# Patient Record
Sex: Female | Born: 1970
Health system: Southern US, Community
[De-identification: ages and names within clinical notes are randomized; demographics above are authoritative.]

## PROBLEM LIST (undated history)

## (undated) DIAGNOSIS — R519 Headache, unspecified: Secondary | ICD-10-CM

## (undated) DIAGNOSIS — R87810 Cervical high risk human papillomavirus (HPV) DNA test positive: Secondary | ICD-10-CM

## (undated) DIAGNOSIS — E559 Vitamin D deficiency, unspecified: Secondary | ICD-10-CM

## (undated) DIAGNOSIS — N920 Excessive and frequent menstruation with regular cycle: Secondary | ICD-10-CM

## (undated) DIAGNOSIS — B019 Varicella without complication: Secondary | ICD-10-CM

## (undated) DIAGNOSIS — T7840XA Allergy, unspecified, initial encounter: Secondary | ICD-10-CM

## (undated) DIAGNOSIS — N809 Endometriosis, unspecified: Secondary | ICD-10-CM

## (undated) DIAGNOSIS — G4700 Insomnia, unspecified: Secondary | ICD-10-CM

## (undated) DIAGNOSIS — J302 Other seasonal allergic rhinitis: Secondary | ICD-10-CM

## (undated) DIAGNOSIS — N760 Acute vaginitis: Secondary | ICD-10-CM

## (undated) DIAGNOSIS — N390 Urinary tract infection, site not specified: Secondary | ICD-10-CM

## (undated) DIAGNOSIS — D259 Leiomyoma of uterus, unspecified: Secondary | ICD-10-CM

## (undated) DIAGNOSIS — R87610 Atypical squamous cells of undetermined significance on cytologic smear of cervix (ASC-US): Secondary | ICD-10-CM

## (undated) DIAGNOSIS — K921 Melena: Secondary | ICD-10-CM

## (undated) DIAGNOSIS — F419 Anxiety disorder, unspecified: Secondary | ICD-10-CM

## (undated) DIAGNOSIS — R51 Headache: Secondary | ICD-10-CM

## (undated) DIAGNOSIS — B9689 Other specified bacterial agents as the cause of diseases classified elsewhere: Secondary | ICD-10-CM

## (undated) HISTORY — PX: UPPER GASTROINTESTINAL ENDOSCOPY: SHX188

## (undated) HISTORY — DX: Vitamin D deficiency, unspecified: E55.9

## (undated) HISTORY — DX: Leiomyoma of uterus, unspecified: D25.9

## (undated) HISTORY — DX: Cervical high risk human papillomavirus (HPV) DNA test positive: R87.810

## (undated) HISTORY — DX: Other seasonal allergic rhinitis: J30.2

## (undated) HISTORY — DX: Anxiety disorder, unspecified: F41.9

## (undated) HISTORY — DX: Endometriosis, unspecified: N80.9

## (undated) HISTORY — DX: Urinary tract infection, site not specified: N39.0

## (undated) HISTORY — DX: Allergy, unspecified, initial encounter: T78.40XA

## (undated) HISTORY — PX: ENDOMETRIAL BIOPSY: SHX622

## (undated) HISTORY — DX: Varicella without complication: B01.9

## (undated) HISTORY — DX: Headache: R51

## (undated) HISTORY — DX: Excessive and frequent menstruation with regular cycle: N92.0

## (undated) HISTORY — DX: Insomnia, unspecified: G47.00

## (undated) HISTORY — PX: COLONOSCOPY: SHX174

## (undated) HISTORY — DX: Other specified bacterial agents as the cause of diseases classified elsewhere: B96.89

## (undated) HISTORY — DX: Melena: K92.1

## (undated) HISTORY — PX: ESOPHAGOGASTRODUODENOSCOPY: SHX1529

## (undated) HISTORY — DX: Headache, unspecified: R51.9

## (undated) HISTORY — DX: Other specified bacterial agents as the cause of diseases classified elsewhere: N76.0

## (undated) HISTORY — DX: Atypical squamous cells of undetermined significance on cytologic smear of cervix (ASC-US): R87.610

---

## 2002-12-07 HISTORY — PX: HYSTEROSCOPY: SHX211

## 2004-11-02 ENCOUNTER — Emergency Department: Payer: Self-pay | Admitting: Emergency Medicine

## 2005-01-07 HISTORY — PX: TOTAL ABDOMINAL HYSTERECTOMY: SHX209

## 2005-02-03 ENCOUNTER — Inpatient Hospital Stay: Payer: Self-pay | Admitting: Unknown Physician Specialty

## 2007-07-20 ENCOUNTER — Ambulatory Visit: Payer: Self-pay | Admitting: Unknown Physician Specialty

## 2011-09-17 ENCOUNTER — Ambulatory Visit: Payer: Self-pay

## 2012-09-21 ENCOUNTER — Ambulatory Visit: Payer: Self-pay

## 2012-09-22 ENCOUNTER — Ambulatory Visit: Payer: Self-pay

## 2013-09-14 ENCOUNTER — Ambulatory Visit: Payer: Self-pay

## 2014-09-25 ENCOUNTER — Ambulatory Visit: Payer: Self-pay

## 2015-08-26 LAB — HM PAP SMEAR: HM PAP: NEGATIVE

## 2015-10-03 ENCOUNTER — Other Ambulatory Visit: Payer: Self-pay | Admitting: Obstetrics and Gynecology

## 2015-10-03 DIAGNOSIS — Z1231 Encounter for screening mammogram for malignant neoplasm of breast: Secondary | ICD-10-CM

## 2015-10-08 ENCOUNTER — Ambulatory Visit
Admission: RE | Admit: 2015-10-08 | Discharge: 2015-10-08 | Disposition: A | Discharge status: Home / Self Care | Payer: BLUE CROSS/BLUE SHIELD | Source: Ambulatory Visit | Attending: Obstetrics and Gynecology | Admitting: Obstetrics and Gynecology

## 2015-10-08 DIAGNOSIS — Z1231 Encounter for screening mammogram for malignant neoplasm of breast: Secondary | ICD-10-CM

## 2016-05-14 DIAGNOSIS — E668 Other obesity: Secondary | ICD-10-CM | POA: Diagnosis not present

## 2016-05-14 DIAGNOSIS — F411 Generalized anxiety disorder: Secondary | ICD-10-CM | POA: Diagnosis not present

## 2016-05-28 DIAGNOSIS — B9689 Other specified bacterial agents as the cause of diseases classified elsewhere: Secondary | ICD-10-CM | POA: Diagnosis not present

## 2016-05-28 DIAGNOSIS — N760 Acute vaginitis: Secondary | ICD-10-CM | POA: Diagnosis not present

## 2016-05-28 DIAGNOSIS — N898 Other specified noninflammatory disorders of vagina: Secondary | ICD-10-CM | POA: Diagnosis not present

## 2016-06-08 DIAGNOSIS — E669 Obesity, unspecified: Secondary | ICD-10-CM | POA: Diagnosis not present

## 2016-06-08 DIAGNOSIS — F411 Generalized anxiety disorder: Secondary | ICD-10-CM | POA: Diagnosis not present

## 2016-06-26 DIAGNOSIS — E669 Obesity, unspecified: Secondary | ICD-10-CM | POA: Diagnosis not present

## 2016-07-10 ENCOUNTER — Encounter: Payer: Self-pay | Admitting: Dietician

## 2016-07-10 ENCOUNTER — Encounter: Payer: BLUE CROSS/BLUE SHIELD | Attending: Internal Medicine | Admitting: Dietician

## 2016-07-10 VITALS — Ht 64.0 in | Wt 222.0 lb

## 2016-07-10 DIAGNOSIS — J309 Allergic rhinitis, unspecified: Secondary | ICD-10-CM

## 2016-07-10 DIAGNOSIS — E669 Obesity, unspecified: Secondary | ICD-10-CM

## 2016-07-10 DIAGNOSIS — N809 Endometriosis, unspecified: Secondary | ICD-10-CM | POA: Insufficient documentation

## 2016-07-10 HISTORY — DX: Allergic rhinitis, unspecified: J30.9

## 2016-07-10 NOTE — Patient Instructions (Signed)
   Keep portions of starchy foods to 1 cup or less (fist-size), and include vegetables as much as possible with your meals.   Track your food intake using your phone app, aiming for 1400 calories daily.   Keep up your healthy food choices, and eating every 3-5 hours.   Continue with regular exercise, great job so far!

## 2016-07-10 NOTE — Progress Notes (Signed)
Medical Nutrition Therapy: Visit start time: 1100  end time: 1200  Assessment:  Diagnosis: obesity  Past medical history: none significant Psychosocial issues/ stress concerns: patient reports moderate stress level; single mom Preferred learning method:  . No preference indicated  Current weight: 222lbs  Height: 5'4" Medications, supplements: reviewed list in chart with patient  Progress and evaluation: Patient reports weight has been stable recently. No prior efforts at weight loss. She has not noticed any weight loss since taking phentermine for the past 2 months.  She has made several diet changes in the past week. Typically has eaten more meals out -- fast food, Cheerwine floats, some cereal at night.   Physical activity: walking 30-40 minutes, 4 times a week  Dietary Intake:  Usual eating pattern includes 3 meals and 2 snacks per day. Dining out frequency: 2-3 meals in past week, was dining out up to 8 meals per week.  Breakfast: 7:30 oatmeal 1 pkg, 2 [place] sausage links, 1c coffee Snack: 10 apple Lunch: 12 [place] sandwich with lett, tom, mayo, cherries, strawberries sm portions; chicken salad and fruit Snack: kellogs protein bar Supper: spaghetti, chicken salad with crackers; burger bites (2) and fries (Chili's);  Snack: none Beverages: water, some Crystal Light, rarely soda  Nutrition Care Education: Topics covered: weight management Basic nutrition: basic food groups, appropriate nutrient balance, appropriate meal and snack schedule, general nutrition guidelines; discussed options for increasing vegetable intake.    Weight control: Importance of low fat and low sugar foods, guidance for 1400 kcal daily intake, encouraging generous vegetable portions and controlled portions of starches and meats; encouraged planning ahead for quick and easy meals to have at home; benefits of tracking food intake and exercise; role of exercise in managing weight.   Nutritional Diagnosis:  [place]-3.3  Overweight/obesity As related to excess calories.  As evidenced by patient report.  Intervention: Instruction as noted above.   Set goals with patient direction.    She does not feel she needs RD follow-up at this time, but will schedule later if necessary.   Education Materials given:  . Food lists/ Planning A Balanced Meal . Sample meal pattern/ menus: Quick and Healthy Meal Ideas . Goals/ instructions  Learner/ who was taught:  . Patient   Level of understanding: Marland Kitchen Verbalizes/ demonstrates competency  Demonstrated degree of understanding via:   Teach back Learning barriers: . None  Willingness to learn/ readiness for change: . Eager, change in progress  Monitoring and Evaluation:  Dietary intake, exercise, and body weight      follow up: prn

## 2016-08-18 DIAGNOSIS — J309 Allergic rhinitis, unspecified: Secondary | ICD-10-CM | POA: Diagnosis not present

## 2016-08-18 DIAGNOSIS — E663 Overweight: Secondary | ICD-10-CM | POA: Diagnosis not present

## 2016-09-02 DIAGNOSIS — Z1239 Encounter for other screening for malignant neoplasm of breast: Secondary | ICD-10-CM | POA: Diagnosis not present

## 2016-09-02 DIAGNOSIS — Z315 Encounter for genetic counseling: Secondary | ICD-10-CM | POA: Diagnosis not present

## 2016-09-02 DIAGNOSIS — Z803 Family history of malignant neoplasm of breast: Secondary | ICD-10-CM | POA: Diagnosis not present

## 2016-09-02 DIAGNOSIS — Z01419 Encounter for gynecological examination (general) (routine) without abnormal findings: Secondary | ICD-10-CM | POA: Diagnosis not present

## 2016-09-10 DIAGNOSIS — Z23 Encounter for immunization: Secondary | ICD-10-CM | POA: Diagnosis not present

## 2016-09-17 DIAGNOSIS — E663 Overweight: Secondary | ICD-10-CM | POA: Diagnosis not present

## 2016-09-17 DIAGNOSIS — F411 Generalized anxiety disorder: Secondary | ICD-10-CM | POA: Diagnosis not present

## 2016-09-17 DIAGNOSIS — J3089 Other allergic rhinitis: Secondary | ICD-10-CM | POA: Diagnosis not present

## 2016-09-29 ENCOUNTER — Other Ambulatory Visit: Payer: Self-pay | Admitting: Obstetrics and Gynecology

## 2016-09-29 DIAGNOSIS — Z1231 Encounter for screening mammogram for malignant neoplasm of breast: Secondary | ICD-10-CM

## 2016-10-14 ENCOUNTER — Ambulatory Visit
Admission: RE | Admit: 2016-10-14 | Discharge: 2016-10-14 | Disposition: A | Discharge status: Home / Self Care | Payer: BLUE CROSS/BLUE SHIELD | Source: Ambulatory Visit | Attending: Obstetrics and Gynecology | Admitting: Obstetrics and Gynecology

## 2016-10-14 DIAGNOSIS — Z1231 Encounter for screening mammogram for malignant neoplasm of breast: Secondary | ICD-10-CM | POA: Insufficient documentation

## 2016-11-16 DIAGNOSIS — B9689 Other specified bacterial agents as the cause of diseases classified elsewhere: Secondary | ICD-10-CM | POA: Diagnosis not present

## 2016-11-16 DIAGNOSIS — N898 Other specified noninflammatory disorders of vagina: Secondary | ICD-10-CM | POA: Diagnosis not present

## 2016-11-16 DIAGNOSIS — N760 Acute vaginitis: Secondary | ICD-10-CM | POA: Diagnosis not present

## 2017-01-11 DIAGNOSIS — B9689 Other specified bacterial agents as the cause of diseases classified elsewhere: Secondary | ICD-10-CM | POA: Diagnosis not present

## 2017-01-11 DIAGNOSIS — N760 Acute vaginitis: Secondary | ICD-10-CM | POA: Diagnosis not present

## 2017-01-11 DIAGNOSIS — N898 Other specified noninflammatory disorders of vagina: Secondary | ICD-10-CM | POA: Diagnosis not present

## 2017-04-30 DIAGNOSIS — E559 Vitamin D deficiency, unspecified: Secondary | ICD-10-CM | POA: Diagnosis not present

## 2017-04-30 DIAGNOSIS — R5383 Other fatigue: Secondary | ICD-10-CM | POA: Diagnosis not present

## 2017-04-30 DIAGNOSIS — E040 Nontoxic diffuse goiter: Secondary | ICD-10-CM | POA: Diagnosis not present

## 2017-04-30 DIAGNOSIS — J301 Allergic rhinitis due to pollen: Secondary | ICD-10-CM | POA: Diagnosis not present

## 2017-05-05 DIAGNOSIS — E049 Nontoxic goiter, unspecified: Secondary | ICD-10-CM | POA: Diagnosis not present

## 2017-05-28 DIAGNOSIS — F411 Generalized anxiety disorder: Secondary | ICD-10-CM | POA: Diagnosis not present

## 2017-05-28 DIAGNOSIS — R010 Benign and innocent cardiac murmurs: Secondary | ICD-10-CM | POA: Diagnosis not present

## 2017-05-28 DIAGNOSIS — E559 Vitamin D deficiency, unspecified: Secondary | ICD-10-CM | POA: Diagnosis not present

## 2017-07-09 DIAGNOSIS — J3089 Other allergic rhinitis: Secondary | ICD-10-CM | POA: Diagnosis not present

## 2017-07-09 DIAGNOSIS — R030 Elevated blood-pressure reading, without diagnosis of hypertension: Secondary | ICD-10-CM | POA: Diagnosis not present

## 2017-07-09 DIAGNOSIS — R010 Benign and innocent cardiac murmurs: Secondary | ICD-10-CM | POA: Diagnosis not present

## 2017-07-09 DIAGNOSIS — F411 Generalized anxiety disorder: Secondary | ICD-10-CM | POA: Diagnosis not present

## 2017-09-06 ENCOUNTER — Telehealth: Payer: Self-pay

## 2017-09-06 ENCOUNTER — Other Ambulatory Visit: Payer: Self-pay | Admitting: Obstetrics and Gynecology

## 2017-09-06 MED ORDER — TERCONAZOLE 0.8 % VA CREA: 1.0000 | TOPICAL_CREAM | Freq: Every day | VAGINAL | 0 refills | Status: AC

## 2017-09-06 NOTE — Telephone Encounter (Signed)
Pt states she does not have a fishy odor.  She uses [facility].  Pt aware rx will be sent.

## 2017-09-06 NOTE — Telephone Encounter (Signed)
Pt calling to see if [facility] would rx cream for BV.  She is having the same sxs as before - itching and a little d/c. [phone]  Left detailed msg that [facility] may want to see her since it was 01/2017 that we saw her last.  We also need name of her pharm.

## 2017-09-06 NOTE — Progress Notes (Signed)
Rx RF terazol for yeast vag sx.

## 2017-09-06 NOTE — Telephone Encounter (Signed)
Is she having fishy odor? Hx of BV and yeast in past, so I will send in crm. Just need to know which one to treat for. Thx.

## 2017-09-06 NOTE — Telephone Encounter (Signed)
Rx terazol eRxd for yeast vag. F/u prn sx.

## 2017-09-10 ENCOUNTER — Encounter: Payer: Self-pay | Admitting: Maternal Newborn

## 2017-09-10 ENCOUNTER — Ambulatory Visit (INDEPENDENT_AMBULATORY_CARE_PROVIDER_SITE_OTHER): Payer: BLUE CROSS/BLUE SHIELD | Admitting: Maternal Newborn

## 2017-09-10 VITALS — BP 122/84 | HR 63 | Ht 65.0 in | Wt 224.0 lb

## 2017-09-10 DIAGNOSIS — N898 Other specified noninflammatory disorders of vagina: Secondary | ICD-10-CM

## 2017-09-10 DIAGNOSIS — B9689 Other specified bacterial agents as the cause of diseases classified elsewhere: Secondary | ICD-10-CM

## 2017-09-10 DIAGNOSIS — N760 Acute vaginitis: Secondary | ICD-10-CM | POA: Diagnosis not present

## 2017-09-10 LAB — POCT WET PREP (WET MOUNT)
Clue Cells Wet Prep Whiff POC: NEGATIVE
TRICHOMONAS WET PREP HPF POC: ABSENT

## 2017-09-10 MED ORDER — SOLOSEC 2 G PO PACK: 1.0000 | PACK | Freq: Once | ORAL | 0 refills | Status: AC

## 2017-09-10 NOTE — Progress Notes (Signed)
Obstetrics & Gynecology Office Visit   Chief Complaint:  Chief Complaint  Patient presents with  . Vaginitis    History of Present Illness: [name] is a 46 y.o. [id number].  No LMP recorded. Patient has had a hysterectomy. She presents today for a problem visit.   Patient complains of an abnormal vaginal discharge for 7 days. Discharge described as: copious, white, thin and malodorous. Vaginal symptoms include local irritation, odor and pain.Vulvar symptoms include local irritation and vulvar itching. STI Risk: Very low risk of STD exposure.   Other associated symptoms: burning after using yeast treatment prescribed by another provider .Menstrual pattern: absent. Contraception: status post hysterectomy.  She denies recent antibiotic exposure, denies changes in soaps, detergents coinciding with the onset of her symptoms.  She has previously self treated or been under treatment by another provider for these symptoms.    Review of Systems: 10 point review of systems negative unless otherwise noted in HPI.  Past Medical History:  Past Medical History:  Diagnosis Date  . Anxiety   . Seasonal allergies   . Vitamin D deficiency     Past Surgical History:  Past Surgical History:  Procedure Laterality Date  . ABDOMINAL HYSTERECTOMY  2006   partial, still has ovaries    Gynecologic History: No LMP recorded. Patient has had a hysterectomy.  Obstetric History: [id number]  Family History:  Family History  Problem Relation Age of Onset  . Breast cancer Paternal Aunt 71  . Diabetes Paternal Aunt     Social History:  Social History   Social History  . Marital status: Divorced    Spouse name: N/A  . Number of children: N/A  . Years of education: N/A   Occupational History  . Not on file.   Social History Main Topics  . Smoking status: Never Smoker  . Smokeless tobacco: Never Used  . Alcohol use Yes     Comment: occasionally  . Drug use: No  . Sexual activity: Yes   Birth control/ protection: Surgical   Other Topics Concern  . Not on file   Social History Narrative  . No narrative on file    Allergies:  Allergies  Allergen Reactions  . Iodine Anaphylaxis    Medications: Prior to Admission medications   Medication Sig Start Date End Date Taking? Authorizing Provider  Cholecalciferol (VITAMIN D3) 50000 units CAPS  09/06/17  Yes [provider]  citalopram (CELEXA) 20 MG tablet Take 20 mg by mouth daily. 07/26/17  Yes [provider]  montelukast (SINGULAIR) 10 MG tablet Take 10 mg by mouth daily. 06/23/16  Yes [provider]    Physical Exam Vitals:  Vitals:   09/10/17 1106  BP: 122/84  Pulse: 63   No LMP recorded. Patient has had a hysterectomy.  General: NAD HEENT: normocephalic, anicteric Pulmonary: No increased work of breathing Genitourinary:  External: Labia appear slightly swollen. Normal urethral meatus, normal  Bartholin's and Skene's glands.    Vagina: Vaginal mucosa appear erythmatous, thin, white, adherent discharge   Cervix: Deferred  Uterus: Surgically absent  Adnexa: deferred  Rectal: deferred  Lymphatic: no evidence of inguinal lymphadenopathy Neurologic: Grossly intact Psychiatric: mood appropriate, affect full  Amsel's Criteria for Bacterial Vaginosis Clinical diagnosis requires presence of 3 of the below 4:  1) Homogenous thin white discharge present 2) Presence of clue cells present 3) Vaginal pH >4.5 present 4) Positive whiff test absent  Assessment: 46 y.o. [id number] with bacterial vaginosis.  Plan:  Problem List Items Addressed This Visit   Visit Diagnoses    Bacterial vaginosis    -  Primary   Relevant Medications   SOLOSEC 2 g PACK   Vaginal irritation         Rx sent to pharmacy, patient education materials given for Solosec. Patient to call if Solosec is not affordable for her, will prescribe Flagyl PRN.  Return if symptoms worsen or fail to improve.  Avel Sensor, CNM 46/04/2017  11:46 AM

## 2017-09-30 ENCOUNTER — Ambulatory Visit: Payer: Self-pay | Admitting: Obstetrics and Gynecology

## 2017-10-02 DIAGNOSIS — Z91048 Other nonmedicinal substance allergy status: Secondary | ICD-10-CM | POA: Diagnosis not present

## 2017-10-02 DIAGNOSIS — R030 Elevated blood-pressure reading, without diagnosis of hypertension: Secondary | ICD-10-CM | POA: Diagnosis not present

## 2017-10-02 DIAGNOSIS — Z79891 Long term (current) use of opiate analgesic: Secondary | ICD-10-CM | POA: Diagnosis not present

## 2017-10-02 DIAGNOSIS — Z79899 Other long term (current) drug therapy: Secondary | ICD-10-CM | POA: Diagnosis not present

## 2017-10-02 DIAGNOSIS — M545 Low back pain: Secondary | ICD-10-CM | POA: Diagnosis not present

## 2017-10-07 ENCOUNTER — Other Ambulatory Visit: Payer: Self-pay | Admitting: Obstetrics and Gynecology

## 2017-10-07 DIAGNOSIS — Z1231 Encounter for screening mammogram for malignant neoplasm of breast: Secondary | ICD-10-CM

## 2017-10-11 ENCOUNTER — Other Ambulatory Visit: Payer: Self-pay

## 2017-10-11 ENCOUNTER — Emergency Department: Payer: BLUE CROSS/BLUE SHIELD

## 2017-10-11 ENCOUNTER — Emergency Department
Admission: EM | Admit: 2017-10-11 | Discharge: 2017-10-11 | Disposition: A | Discharge status: Home / Self Care | Payer: BLUE CROSS/BLUE SHIELD | Attending: Emergency Medicine | Admitting: Emergency Medicine

## 2017-10-11 ENCOUNTER — Encounter: Payer: Self-pay | Admitting: Emergency Medicine

## 2017-10-11 DIAGNOSIS — S82832A Other fracture of upper and lower end of left fibula, initial encounter for closed fracture: Secondary | ICD-10-CM | POA: Diagnosis not present

## 2017-10-11 DIAGNOSIS — Y929 Unspecified place or not applicable: Secondary | ICD-10-CM | POA: Insufficient documentation

## 2017-10-11 DIAGNOSIS — Y998 Other external cause status: Secondary | ICD-10-CM | POA: Insufficient documentation

## 2017-10-11 DIAGNOSIS — S8992XA Unspecified injury of left lower leg, initial encounter: Secondary | ICD-10-CM | POA: Diagnosis present

## 2017-10-11 DIAGNOSIS — F419 Anxiety disorder, unspecified: Secondary | ICD-10-CM | POA: Insufficient documentation

## 2017-10-11 DIAGNOSIS — S82862A Displaced Maisonneuve's fracture of left leg, initial encounter for closed fracture: Secondary | ICD-10-CM

## 2017-10-11 DIAGNOSIS — S82861A Displaced Maisonneuve's fracture of right leg, initial encounter for closed fracture: Secondary | ICD-10-CM | POA: Diagnosis not present

## 2017-10-11 DIAGNOSIS — S82432A Displaced oblique fracture of shaft of left fibula, initial encounter for closed fracture: Secondary | ICD-10-CM | POA: Diagnosis not present

## 2017-10-11 DIAGNOSIS — Y9389 Activity, other specified: Secondary | ICD-10-CM | POA: Diagnosis not present

## 2017-10-11 DIAGNOSIS — W010XXA Fall on same level from slipping, tripping and stumbling without subsequent striking against object, initial encounter: Secondary | ICD-10-CM | POA: Diagnosis not present

## 2017-10-11 DIAGNOSIS — M25572 Pain in left ankle and joints of left foot: Secondary | ICD-10-CM | POA: Diagnosis not present

## 2017-10-11 DIAGNOSIS — W19XXXA Unspecified fall, initial encounter: Secondary | ICD-10-CM | POA: Diagnosis not present

## 2017-10-11 MED ORDER — HYDROMORPHONE HCL 1 MG/ML IJ SOLN
0.5000 mg | Freq: Once | INTRAMUSCULAR | Status: AC
  Administered 2017-10-11: 0.5 mg via INTRAVENOUS

## 2017-10-11 MED ORDER — HYDROMORPHONE HCL 1 MG/ML IJ SOLN
INTRAMUSCULAR | Status: AC
  Filled 2017-10-11: qty 1

## 2017-10-11 MED ORDER — HYDROMORPHONE HCL 1 MG/ML IJ SOLN
1.0000 mg | Freq: Once | INTRAMUSCULAR | Status: AC
  Administered 2017-10-11: 1 mg via INTRAVENOUS
  Filled 2017-10-11: qty 1

## 2017-10-11 MED ORDER — OXYCODONE-ACETAMINOPHEN 7.5-325 MG PO TABS: 1.0000 | ORAL_TABLET | ORAL | 0 refills | Status: DC | PRN

## 2017-10-11 NOTE — ED Notes (Signed)
Pt asking for something to drink, MD made aware and states pt can not have anything but ice chips at this time until splint is put on. Pt made aware of what MD said and if fine with that. cup of ice chips given to pt at this time. Once pt is given pain medication splint will be put on.

## 2017-10-11 NOTE — ED Provider Notes (Signed)
[facility] Emergency Department Provider Note       Time seen: ----------------------------------------- 7:09 AM on 10/11/2017 -----------------------------------------    I have reviewed the triage vital signs and the nursing notes.   HISTORY   Chief Complaint Fall and Ankle Injury   HPI Erica Mccall is a 46 y.o. female with a history of seasonal allergy who presents to the ED for a fall.  Patient arrives by EMS after falling while going about her normal activities this morning.  She denies feeling ill prior to the fall.  She states she fell from a standing position, has no other injuries other than her left ankle.  She received some fentanyl in route which helped her pain.  Pain is currently moderate in the left ankle  Past Medical History:  Diagnosis Date  . Anxiety   . Seasonal allergies   . Vitamin D deficiency     Patient Active Problem List   Diagnosis Date Noted  . Allergic rhinitis 07/10/2016  . Endometriosis 07/10/2016    Past Surgical History:  Procedure Laterality Date  . ABDOMINAL HYSTERECTOMY  2006   partial, still has ovaries    Allergies Iodine  Social History Social History   Tobacco Use  . Smoking status: Never Smoker  . Smokeless tobacco: Never Used  Substance Use Topics  . Alcohol use: Yes    Comment: occasionally  . Drug use: No    Review of Systems Constitutional: Negative for fever. Cardiovascular: Negative for chest pain. Respiratory: Negative for shortness of breath. Gastrointestinal: Negative for abdominal pain, vomiting and diarrhea. Musculoskeletal: Positive for left ankle pain Skin: Negative for rash. Neurological: Negative for headaches, focal weakness or numbness.  All systems negative/normal/unremarkable except as stated in the HPI  ____________________________________________   PHYSICAL EXAM:  VITAL SIGNS: ED Triage Vitals  Enc Vitals Group     BP 10/11/17 0707 126/73     Pulse Rate  10/11/17 0707 76     Resp 10/11/17 0707 18     Temp 10/11/17 0707 98 F (36.7 C)     Temp Source 10/11/17 0707 Oral     SpO2 10/11/17 0707 94 %     Weight 10/11/17 0708 224 lb (101.6 kg)     Height 10/11/17 0708 5\' 5"  (1.651 m)     Head Circumference --      Peak Flow --      Pain Score 10/11/17 0707 6     Pain Loc --      Pain Edu? --      Excl. in [place]? --     Constitutional: Alert and oriented. Well appearing and in no distress. Eyes: Conjunctivae are normal. Normal extraocular movements. Cardiovascular: Normal rate, regular rhythm. No murmurs, rubs, or gallops. Respiratory: Normal respiratory effort without tachypnea nor retractions. Breath sounds are clear and equal bilaterally. No wheezes/rales/rhonchi. Gastrointestinal: Soft and nontender. Normal bowel sounds Musculoskeletal: Pain with range of motion of the left ankle, there is diffuse left ankle swelling, significant tenderness over the left ankle laterally, crepitus is palpated over the lateral malleolus.  Compartments are soft at this time Neurologic:  Normal speech and language. No gross focal neurologic deficits are appreciated.  Skin:  Skin is warm, dry and intact. No rash noted. Psychiatric: Mood and affect are normal. Speech and behavior are normal.  ____________________________________________  ED COURSE:  Pertinent labs & imaging results that were available during my care of the patient were reviewed by me and considered in my medical  decision making (see chart for details). Patient presents for left ankle injury and likely fracture, we will assess with labs and imaging as indicated. Clinical Course as of Oct 12 1019  Mon Oct 11, 2017  0488 Patient appears to have a Maisonneuve fracture  [JW]  [phone] Dr. Harlow Mares has been paged  [JW]  [phone] Initial plan from Dr. Harlow Mares is to splint the patient and have him go home and follow-up in the office.  We will attempt to splint her and see if she can ambulate with crutches or a  walker.  [JW]    Clinical Course User Index [JW] Earleen Newport, MD   Procedures ____________________________________________   LABS (pertinent positives/negatives)  Labs Reviewed - No data to display  RADIOLOGY Images were viewed by me  Left ankle x-rays IMPRESSION: Lateral subluxation of the tibiotalar joint with small avulsion fracture at the medial malleolus. Anticipate fibular neck fracture and syndesmotic injury. Recommend tib/fib series. IMPRESSION: Oblique slightly displaced fracture of the proximal left fibula shaft. ____________________________________________  DIFFERENTIAL DIAGNOSIS   Sprain, fracture, dislocation  FINAL ASSESSMENT AND PLAN  Maisonneuve Fracture   Plan: Patient had presented for a fall with ankle pain and leg swelling.  Patients imaging was concerning for Maisonneuve fracture.  I discussed with orthopedics who recommended splinting and outpatient follow-up.  She was placed in a posterior long leg splint with supporting stirrup splint.  Currently pain seems to be under control and she will proceed with outpatient follow-up.   Earleen Newport, MD   Note: This note was generated in part or whole with voice recognition software. Voice recognition is usually quite accurate but there are transcription errors that can and very often do occur. I apologize for any typographical errors that were not detected and corrected.     Earleen Newport, MD 10/11/17 1022

## 2017-10-11 NOTE — ED Triage Notes (Signed)
Pt arrived via EMS after falling when trying to put watch on. Pt denies dizziness prior to fall. EMS reports obvious deformity to left ankle. EMS reports VSS. EMS administered fentanyl 100 mcg IV. Pt alert and oriented on arrival.

## 2017-10-11 NOTE — Discharge Instructions (Signed)
You have a Maisonneuve Fracture - injury resulting from external rotation force to ankle w/ transmission of the force thru the interosseous membrane, which extis thru a proximal fibular fracture

## 2017-10-12 ENCOUNTER — Ambulatory Visit: Payer: Self-pay | Admitting: Orthopedic Surgery

## 2017-10-12 DIAGNOSIS — S82402S Unspecified fracture of shaft of left fibula, sequela: Secondary | ICD-10-CM | POA: Diagnosis not present

## 2017-10-12 DIAGNOSIS — S93432A Sprain of tibiofibular ligament of left ankle, initial encounter: Secondary | ICD-10-CM | POA: Diagnosis not present

## 2017-10-12 MED ORDER — CEFAZOLIN SODIUM-DEXTROSE 2-4 GM/100ML-% IV SOLN
2.0000 g | INTRAVENOUS | Status: AC
  Administered 2017-10-13: 2 g via INTRAVENOUS

## 2017-10-13 ENCOUNTER — Ambulatory Visit: Payer: BLUE CROSS/BLUE SHIELD | Admitting: Anesthesiology

## 2017-10-13 ENCOUNTER — Encounter
Admission: RE | Disposition: A | Discharge status: Home / Self Care | Payer: Self-pay | Source: Ambulatory Visit | Attending: Orthopedic Surgery

## 2017-10-13 ENCOUNTER — Ambulatory Visit
Admission: RE | Admit: 2017-10-13 | Discharge: 2017-10-13 | Disposition: A | Discharge status: Home / Self Care | Payer: BLUE CROSS/BLUE SHIELD | Source: Ambulatory Visit | Attending: Orthopedic Surgery | Admitting: Orthopedic Surgery

## 2017-10-13 ENCOUNTER — Encounter: Payer: Self-pay | Admitting: Anesthesiology

## 2017-10-13 ENCOUNTER — Other Ambulatory Visit: Payer: Self-pay

## 2017-10-13 DIAGNOSIS — Z79899 Other long term (current) drug therapy: Secondary | ICD-10-CM | POA: Diagnosis not present

## 2017-10-13 DIAGNOSIS — S8255XA Nondisplaced fracture of medial malleolus of left tibia, initial encounter for closed fracture: Secondary | ICD-10-CM | POA: Diagnosis not present

## 2017-10-13 DIAGNOSIS — E559 Vitamin D deficiency, unspecified: Secondary | ICD-10-CM | POA: Insufficient documentation

## 2017-10-13 DIAGNOSIS — Z6837 Body mass index (BMI) 37.0-37.9, adult: Secondary | ICD-10-CM | POA: Diagnosis not present

## 2017-10-13 DIAGNOSIS — S82402A Unspecified fracture of shaft of left fibula, initial encounter for closed fracture: Secondary | ICD-10-CM | POA: Diagnosis present

## 2017-10-13 DIAGNOSIS — E669 Obesity, unspecified: Secondary | ICD-10-CM | POA: Diagnosis not present

## 2017-10-13 DIAGNOSIS — Y9389 Activity, other specified: Secondary | ICD-10-CM | POA: Insufficient documentation

## 2017-10-13 DIAGNOSIS — S93432A Sprain of tibiofibular ligament of left ankle, initial encounter: Secondary | ICD-10-CM | POA: Insufficient documentation

## 2017-10-13 DIAGNOSIS — G8918 Other acute postprocedural pain: Secondary | ICD-10-CM | POA: Diagnosis not present

## 2017-10-13 DIAGNOSIS — Z803 Family history of malignant neoplasm of breast: Secondary | ICD-10-CM | POA: Insufficient documentation

## 2017-10-13 DIAGNOSIS — F419 Anxiety disorder, unspecified: Secondary | ICD-10-CM | POA: Diagnosis not present

## 2017-10-13 DIAGNOSIS — W19XXXA Unspecified fall, initial encounter: Secondary | ICD-10-CM | POA: Diagnosis not present

## 2017-10-13 DIAGNOSIS — Z833 Family history of diabetes mellitus: Secondary | ICD-10-CM | POA: Insufficient documentation

## 2017-10-13 DIAGNOSIS — Z90710 Acquired absence of both cervix and uterus: Secondary | ICD-10-CM | POA: Insufficient documentation

## 2017-10-13 DIAGNOSIS — M25572 Pain in left ankle and joints of left foot: Secondary | ICD-10-CM | POA: Diagnosis not present

## 2017-10-13 HISTORY — PX: ORIF ANKLE FRACTURE: SHX5408

## 2017-10-13 HISTORY — PX: LEG SURGERY: SHX1003

## 2017-10-13 SURGERY — OPEN REDUCTION INTERNAL FIXATION (ORIF) ANKLE FRACTURE
Anesthesia: Choice | Laterality: Left

## 2017-10-13 SURGERY — OPEN REDUCTION INTERNAL FIXATION (ORIF) ANKLE FRACTURE
Anesthesia: Choice | Site: Ankle | Laterality: Left | Wound class: Clean

## 2017-10-13 MED ORDER — CEFAZOLIN SODIUM-DEXTROSE 2-4 GM/100ML-% IV SOLN
INTRAVENOUS | Status: AC
  Filled 2017-10-13: qty 100

## 2017-10-13 MED ORDER — FENTANYL CITRATE (PF) 100 MCG/2ML IJ SOLN
25.0000 ug | INTRAMUSCULAR | Status: DC | PRN
  Administered 2017-10-13 (×4): 25 ug via INTRAVENOUS

## 2017-10-13 MED ORDER — MIDAZOLAM HCL 2 MG/2ML IJ SOLN
INTRAMUSCULAR | Status: AC
  Filled 2017-10-13: qty 4

## 2017-10-13 MED ORDER — DOCUSATE SODIUM 100 MG PO CAPS: 100.0000 mg | ORAL_CAPSULE | Freq: Two times a day (BID) | ORAL | Status: DC

## 2017-10-13 MED ORDER — METOCLOPRAMIDE HCL 10 MG PO TABS: 5.0000 mg | ORAL_TABLET | Freq: Three times a day (TID) | ORAL | Status: DC | PRN

## 2017-10-13 MED ORDER — PHENYLEPHRINE HCL 10 MG/ML IJ SOLN
INTRAMUSCULAR | Status: DC | PRN
  Administered 2017-10-13: 100 ug via INTRAVENOUS
  Administered 2017-10-13: 50 ug via INTRAVENOUS

## 2017-10-13 MED ORDER — ROPIVACAINE HCL 5 MG/ML IJ SOLN
INTRAMUSCULAR | Status: DC | PRN
  Administered 2017-10-13: 30 mL via EPIDURAL

## 2017-10-13 MED ORDER — ASPIRIN EC 81 MG PO TBEC: 81.0000 mg | DELAYED_RELEASE_TABLET | Freq: Every day | ORAL | 0 refills | Status: AC

## 2017-10-13 MED ORDER — DEXAMETHASONE SODIUM PHOSPHATE 10 MG/ML IJ SOLN
INTRAMUSCULAR | Status: DC | PRN
  Administered 2017-10-13: 5 mg via INTRAVENOUS

## 2017-10-13 MED ORDER — MIDAZOLAM HCL 2 MG/2ML IJ SOLN
INTRAMUSCULAR | Status: DC | PRN
  Administered 2017-10-13 (×2): 1 mg via INTRAVENOUS
  Administered 2017-10-13: 2 mg via INTRAVENOUS

## 2017-10-13 MED ORDER — MIDAZOLAM HCL 2 MG/2ML IJ SOLN
1.0000 mg | Freq: Once | INTRAMUSCULAR | Status: AC
  Administered 2017-10-13: 1 mg via INTRAVENOUS

## 2017-10-13 MED ORDER — HYDROMORPHONE HCL 1 MG/ML IJ SOLN
INTRAMUSCULAR | Status: AC
  Administered 2017-10-13: 0.5 mg via INTRAVENOUS
  Filled 2017-10-13: qty 1

## 2017-10-13 MED ORDER — ACETAMINOPHEN 10 MG/ML IV SOLN
INTRAVENOUS | Status: AC
  Filled 2017-10-13: qty 100

## 2017-10-13 MED ORDER — LACTATED RINGERS IV SOLN
INTRAVENOUS | Status: DC
  Administered 2017-10-13 (×2): via INTRAVENOUS

## 2017-10-13 MED ORDER — OXYCODONE-ACETAMINOPHEN 5-325 MG PO TABS
ORAL_TABLET | ORAL | Status: AC
  Filled 2017-10-13: qty 1

## 2017-10-13 MED ORDER — SODIUM CHLORIDE 0.9 % IR SOLN
Status: DC | PRN
  Administered 2017-10-13: 1000 mL

## 2017-10-13 MED ORDER — FENTANYL CITRATE (PF) 100 MCG/2ML IJ SOLN
INTRAMUSCULAR | Status: AC
  Filled 2017-10-13: qty 2

## 2017-10-13 MED ORDER — ONDANSETRON HCL 4 MG/2ML IJ SOLN
INTRAMUSCULAR | Status: DC | PRN
  Administered 2017-10-13: 4 mg via INTRAVENOUS

## 2017-10-13 MED ORDER — GABAPENTIN 300 MG PO CAPS
ORAL_CAPSULE | ORAL | Status: AC
  Administered 2017-10-13: 300 mg via ORAL
  Filled 2017-10-13: qty 1

## 2017-10-13 MED ORDER — OXYCODONE-ACETAMINOPHEN 5-325 MG PO TABS
1.0000 | ORAL_TABLET | ORAL | Status: DC | PRN
  Administered 2017-10-13: 1 via ORAL

## 2017-10-13 MED ORDER — FENTANYL CITRATE (PF) 100 MCG/2ML IJ SOLN
INTRAMUSCULAR | Status: AC
  Administered 2017-10-13: 50 ug via INTRAVENOUS
  Filled 2017-10-13: qty 2

## 2017-10-13 MED ORDER — FENTANYL CITRATE (PF) 100 MCG/2ML IJ SOLN
INTRAMUSCULAR | Status: DC | PRN
  Administered 2017-10-13: 25 ug via INTRAVENOUS
  Administered 2017-10-13: 50 ug via INTRAVENOUS
  Administered 2017-10-13: 25 ug via INTRAVENOUS

## 2017-10-13 MED ORDER — FENTANYL CITRATE (PF) 100 MCG/2ML IJ SOLN
50.0000 ug | Freq: Once | INTRAMUSCULAR | Status: AC
  Administered 2017-10-13: 50 ug via INTRAVENOUS

## 2017-10-13 MED ORDER — ACETAMINOPHEN 10 MG/ML IV SOLN
INTRAVENOUS | Status: DC | PRN
  Administered 2017-10-13: 1000 mg via INTRAVENOUS

## 2017-10-13 MED ORDER — METOCLOPRAMIDE HCL 5 MG/ML IJ SOLN: 5.0000 mg | Freq: Three times a day (TID) | INTRAMUSCULAR | Status: DC | PRN

## 2017-10-13 MED ORDER — OXYCODONE-ACETAMINOPHEN 7.5-325 MG PO TABS: 1.0000 | ORAL_TABLET | ORAL | Status: DC | PRN

## 2017-10-13 MED ORDER — HYDROMORPHONE HCL 1 MG/ML IJ SOLN: 0.5000 mg | INTRAMUSCULAR | Status: DC | PRN

## 2017-10-13 MED ORDER — LACTATED RINGERS IV SOLN
INTRAVENOUS | Status: DC
Start: 2017-10-13 — End: 2017-10-13

## 2017-10-13 MED ORDER — GABAPENTIN 300 MG PO CAPS
300.0000 mg | ORAL_CAPSULE | Freq: Once | ORAL | Status: AC
  Administered 2017-10-13: 300 mg via ORAL

## 2017-10-13 MED ORDER — MIDAZOLAM HCL 2 MG/2ML IJ SOLN
INTRAMUSCULAR | Status: AC
  Administered 2017-10-13: 1 mg via INTRAVENOUS
  Filled 2017-10-13: qty 2

## 2017-10-13 MED ORDER — BUPIVACAINE-EPINEPHRINE (PF) 0.25% -1:200000 IJ SOLN
INTRAMUSCULAR | Status: AC
  Filled 2017-10-13: qty 30

## 2017-10-13 MED ORDER — PROPOFOL 10 MG/ML IV BOLUS
INTRAVENOUS | Status: AC
  Filled 2017-10-13: qty 20

## 2017-10-13 MED ORDER — HYDROMORPHONE HCL 1 MG/ML IJ SOLN
0.5000 mg | INTRAMUSCULAR | Status: AC | PRN
  Administered 2017-10-13 (×4): 0.5 mg via INTRAVENOUS

## 2017-10-13 MED ORDER — ROPIVACAINE HCL 5 MG/ML IJ SOLN
INTRAMUSCULAR | Status: AC
  Filled 2017-10-13: qty 30

## 2017-10-13 MED ORDER — ONDANSETRON HCL 4 MG PO TABS: 4.0000 mg | ORAL_TABLET | Freq: Four times a day (QID) | ORAL | Status: DC | PRN

## 2017-10-13 MED ORDER — FENTANYL CITRATE (PF) 100 MCG/2ML IJ SOLN
INTRAMUSCULAR | Status: AC
  Administered 2017-10-13: 25 ug via INTRAVENOUS
  Filled 2017-10-13: qty 2

## 2017-10-13 MED ORDER — GLYCOPYRROLATE 0.2 MG/ML IJ SOLN
INTRAMUSCULAR | Status: DC | PRN
  Administered 2017-10-13: 0.2 mg via INTRAVENOUS

## 2017-10-13 MED ORDER — BACITRACIN 50000 UNITS IM SOLR
INTRAMUSCULAR | Status: AC
  Filled 2017-10-13: qty 1

## 2017-10-13 MED ORDER — ONDANSETRON HCL 4 MG/2ML IJ SOLN: 4.0000 mg | Freq: Once | INTRAMUSCULAR | Status: DC | PRN

## 2017-10-13 MED ORDER — ACETAMINOPHEN 500 MG PO TABS
ORAL_TABLET | ORAL | Status: AC
  Administered 2017-10-13: 1000 mg via ORAL
  Filled 2017-10-13: qty 2

## 2017-10-13 MED ORDER — CHLORHEXIDINE GLUCONATE 4 % EX LIQD: 60.0000 mL | Freq: Once | CUTANEOUS | Status: DC

## 2017-10-13 MED ORDER — ONDANSETRON HCL 4 MG/2ML IJ SOLN: 4.0000 mg | Freq: Four times a day (QID) | INTRAMUSCULAR | Status: DC | PRN

## 2017-10-13 MED ORDER — SODIUM CHLORIDE FLUSH 0.9 % IV SOLN
INTRAVENOUS | Status: AC
  Filled 2017-10-13: qty 10

## 2017-10-13 MED ORDER — OXYCODONE-ACETAMINOPHEN 7.5-325 MG PO TABS: 1.0000 | ORAL_TABLET | ORAL | 0 refills | Status: DC | PRN

## 2017-10-13 MED ORDER — DOCUSATE SODIUM 100 MG PO CAPS: 100.0000 mg | ORAL_CAPSULE | Freq: Every day | ORAL | 2 refills | Status: DC | PRN

## 2017-10-13 MED ORDER — PROPOFOL 10 MG/ML IV BOLUS
INTRAVENOUS | Status: DC | PRN
  Administered 2017-10-13: 130 mg via INTRAVENOUS

## 2017-10-13 MED ORDER — OXYCODONE-ACETAMINOPHEN 7.5-325 MG PO TABS
ORAL_TABLET | ORAL | Status: AC
  Filled 2017-10-13: qty 1

## 2017-10-13 MED ORDER — ACETAMINOPHEN 500 MG PO TABS
1000.0000 mg | ORAL_TABLET | Freq: Once | ORAL | Status: AC
  Administered 2017-10-13: 1000 mg via ORAL

## 2017-10-13 SURGICAL SUPPLY — 39 items
BANDAGE ELASTIC 6 CLIP ST LF (GAUZE/BANDAGES/DRESSINGS) ×4 IMPLANT
BLADE SURG 10 STRL SS SAFETY (BLADE) ×2 IMPLANT
BLADE SURG 15 STRL SS SAFETY (BLADE) ×6 IMPLANT
BNDG COHESIVE 6X5 TAN STRL LF (GAUZE/BANDAGES/DRESSINGS) ×2 IMPLANT
BNDG ESMARK 6X12 TAN STRL LF (GAUZE/BANDAGES/DRESSINGS) ×4 IMPLANT
BRUSH SCRUB EZ  4% CHG (MISCELLANEOUS) ×1
BRUSH SCRUB EZ 4% CHG (MISCELLANEOUS) ×1 IMPLANT
CANISTER SUCT 1200ML W/VALVE (MISCELLANEOUS) ×2 IMPLANT
CHLORAPREP W/TINT 26ML (MISCELLANEOUS) ×4 IMPLANT
DRAPE FLUOR MINI C-ARM 54X84 (DRAPES) ×2 IMPLANT
DRAPE SHEET LG 3/4 BI-LAMINATE (DRAPES) ×2 IMPLANT
ELECT REM PT RETURN 9FT ADLT (ELECTROSURGICAL) ×2
ELECTRODE REM PT RTRN 9FT ADLT (ELECTROSURGICAL) ×1 IMPLANT
GAUZE PETRO XEROFOAM 1X8 (MISCELLANEOUS) ×2 IMPLANT
GLOVE INDICATOR 8.0 STRL GRN (GLOVE) ×6 IMPLANT
GLOVE SURG ORTHO 8.0 STRL STRW (GLOVE) ×6 IMPLANT
GOWN STRL REUS W/ TWL LRG LVL3 (GOWN DISPOSABLE) ×1 IMPLANT
GOWN STRL REUS W/ TWL XL LVL3 (GOWN DISPOSABLE) ×1 IMPLANT
GOWN STRL REUS W/TWL LRG LVL3 (GOWN DISPOSABLE) ×1
GOWN STRL REUS W/TWL XL LVL3 (GOWN DISPOSABLE) ×1
K-WIRE BB-TAK (WIRE) ×2
KIT RM TURNOVER STRD PROC AR (KITS) ×2 IMPLANT
KWIRE BB-TAK (WIRE) ×1 IMPLANT
NS IRRIG 1000ML POUR BTL (IV SOLUTION) ×2 IMPLANT
PACK EXTREMITY ARMC (MISCELLANEOUS) ×2 IMPLANT
PAD ABD DERMACEA PRESS 5X9 (GAUZE/BANDAGES/DRESSINGS) ×2 IMPLANT
PAD CAST CTTN 4X4 STRL (SOFTGOODS) ×2 IMPLANT
PADDING CAST COTTON 4X4 STRL (SOFTGOODS) ×2
PLATE 2 HOLE SS STRL (Plate) ×2 IMPLANT
REPAIR TROPE KNTLS SS SYNDESMO (Orthopedic Implant) ×4 IMPLANT
SPLINT CAST 1 STEP 5X30 WHT (MISCELLANEOUS) ×2 IMPLANT
STAPLER SKIN PROX 35W (STAPLE) ×2 IMPLANT
SUT VIC AB 2-0 SH 27 (SUTURE) ×1
SUT VIC AB 2-0 SH 27XBRD (SUTURE) ×1 IMPLANT
SUT VIC AB 3-0 SH 27 (SUTURE) ×1
SUT VIC AB 3-0 SH 27X BRD (SUTURE) ×1 IMPLANT
TAPE SURG TRANSPORE 1 IN (GAUZE/BANDAGES/DRESSINGS) ×1 IMPLANT
TAPE SURGICAL TRANSPORE 1 IN (GAUZE/BANDAGES/DRESSINGS) ×1
TOWEL OR 17X26 4PK STRL BLUE (TOWEL DISPOSABLE) ×4 IMPLANT

## 2017-10-13 NOTE — Anesthesia Procedure Notes (Signed)
Procedure Name: LMA Insertion Date/Time: 10/13/2017 1:56 PM Performed by: Lorie Apley, CRNA Pre-anesthesia Checklist: Patient identified, Patient being monitored, Timeout performed, Emergency Drugs available and Suction available Patient Re-evaluated:Patient Re-evaluated prior to induction Oxygen Delivery Method: Circle system utilized Preoxygenation: Pre-oxygenation with 100% oxygen Induction Type: IV induction Ventilation: Mask ventilation without difficulty LMA: LMA inserted LMA Size: 4.0 Tube type: Oral Number of attempts: 1 Placement Confirmation: positive ETCO2 and breath sounds checked- equal and bilateral Tube secured with: Tape Dental Injury: Teeth and Oropharynx as per pre-operative assessment

## 2017-10-13 NOTE — H&P (Signed)
PREOPERATIVE H&P  Chief Complaint: [phone] Sprain of tibiofibular ligament of left ankle, init encntr S82.402S Unspecified fracture of shaft of left fibula, sequela  HPI: Erica Mccall is a 46 y.o. female who presents for preoperative history and physical with a diagnosis of S93.432A Sprain of tibiofibular ligament of left ankle, init encntr S82.402S Unspecified fracture of shaft of left fibula, sequela. Symptoms are rated as moderate to severe, and have been worsening.  This is significantly impairing activities of daily living.  She has elected for surgical management.   Past Medical History:  Diagnosis Date  . Anxiety   . Anxiety   . Seasonal allergies   . Vitamin D deficiency    Past Surgical History:  Procedure Laterality Date  . ABDOMINAL HYSTERECTOMY  2006   partial, still has ovaries   Social History   Socioeconomic History  . Marital status: Divorced    Spouse name: None  . Number of children: None  . Years of education: None  . Highest education level: None  Social Needs  . Financial resource strain: None  . Food insecurity - worry: None  . Food insecurity - inability: None  . Transportation needs - medical: None  . Transportation needs - non-medical: None  Occupational History  . None  Tobacco Use  . Smoking status: Never Smoker  . Smokeless tobacco: Never Used  Substance and Sexual Activity  . Alcohol use: Yes    Comment: occasionally  . Drug use: No  . Sexual activity: Yes    Birth control/protection: Surgical  Other Topics Concern  . None  Social History Narrative  . None   Family History  Problem Relation Age of Onset  . Breast cancer Paternal Aunt 27  . Diabetes Paternal Aunt    Allergies  Allergen Reactions  . Iodine Hives   Prior to Admission medications   Medication Sig Start Date End Date Taking? Authorizing Provider  Cholecalciferol (VITAMIN D3) 50000 units CAPS Take 50,000 Units every Thursday by mouth.  09/06/17  Yes [provider]  citalopram (CELEXA) 20 MG tablet Take 20 mg by mouth daily. 07/26/17  Yes [provider]  ibuprofen (ADVIL,MOTRIN) 200 MG tablet Take 400 mg every 6 (six) hours as needed by mouth for headache.   Yes [provider]  montelukast (SINGULAIR) 10 MG tablet Take 10 mg at bedtime by mouth.  06/23/16  Yes [provider]  oxyCODONE-acetaminophen (PERCOCET) 7.5-325 MG tablet Take 1 tablet every 4 (four) hours as needed by mouth for severe pain. 10/11/17 10/11/18 Yes Earleen Newport, MD  Probiotic CAPS Take 1 capsule daily by mouth.   Yes [provider]     Positive ROS: All other systems have been reviewed and were otherwise negative with the exception of those mentioned in the HPI and as above.  Physical Exam: General: Alert, no acute distress Cardiovascular: Regular rate and rhythm, no murmurs rubs or gallops.  No pedal edema Respiratory: Clear to auscultation bilaterally, no wheezes rales or rhonchi. No cyanosis, no use of accessory musculature GI: No organomegaly, abdomen is soft and non-tender nondistended with positive bowel sounds. Skin: Skin intact, no lesions within the operative field. Neurologic: Sensation intact distally Psychiatric: Patient is competent for consent with normal mood and affect Lymphatic: No axillary or cervical lymphadenopathy  MUSCULOSKELETAL: able to move toes, sensation intact, good cap refill  Assessment: S93.432A Sprain of tibiofibular ligament of left ankle, init encntr S82.402S Unspecified fracture of shaft of left fibula, sequela  Plan:  Plan for Procedure(s): OPEN REDUCTION INTERNAL FIXATION (ORIF) ANKLE FRACTURE Syndesmotic injury  I discussed the risks and benefits of surgery. The risks include but are not limited to infection, bleeding requiring blood transfusion, nerve or blood vessel injury, joint stiffness or loss of motion, persistent pain, weakness or instability, malunion, nonunion and  hardware failure and the need for further surgery. Medical risks include but are not limited to DVT and pulmonary embolism, myocardial infarction, stroke, pneumonia, respiratory failure and death. Patient understood these risks and wished to proceed.   Will consult anesthesia for a regional nerve block for post-operative pain control  Lovell Sheehan, MD   10/13/2017 1:26 PM

## 2017-10-13 NOTE — Anesthesia Preprocedure Evaluation (Signed)
Anesthesia Evaluation  Patient identified by MRN, date of birth, ID band Patient awake    Reviewed: Allergy & Precautions, NPO status , Patient's Chart, lab work & pertinent test results, reviewed documented beta blocker date and time   Airway Mallampati: III  TM Distance: >3 FB     Dental  (+) Chipped   Pulmonary           Cardiovascular      Neuro/Psych Anxiety    GI/Hepatic   Endo/Other    Renal/GU      Musculoskeletal   Abdominal   Peds  Hematology   Anesthesia Other Findings Obese.  Reproductive/Obstetrics                             Anesthesia Physical Anesthesia Plan  ASA: III  Anesthesia Plan:    Post-op Pain Management:    Induction:   PONV Risk Score and Plan: 2  Airway Management Planned:   Additional Equipment:   Intra-op Plan:   Post-operative Plan:   Informed Consent: I have reviewed the patients History and Physical, chart, labs and discussed the procedure including the risks, benefits and alternatives for the proposed anesthesia with the patient or authorized representative who has indicated his/her understanding and acceptance.     Plan Discussed with: CRNA  Anesthesia Plan Comments:         Anesthesia Quick Evaluation

## 2017-10-13 NOTE — Anesthesia Procedure Notes (Signed)
Anesthesia Regional Block: Popliteal block   Pre-Anesthetic Checklist: ,, timeout performed, Correct Patient, Correct Site, Correct Laterality, Correct Procedure, Correct Position, site marked, Risks and benefits discussed,  Surgical consent,  Pre-op evaluation,  At surgeon's request and post-op pain management  Laterality: Lower  Prep: chloraprep       Needles:  Injection technique: Single-shot  Needle Type: Echogenic Needle     Needle Length: 9cm  Needle Gauge: 21     Additional Needles:   Procedures:,,,, ultrasound used (permanent image in chart),,,,  Narrative:  Injection made incrementally with aspirations every 5 mL.  Performed by: Personally  Anesthesiologist: Piscitello, Joseph K, MD  Additional Notes: Functioning IV was confirmed and monitors were applied.  A echogenic needle was used. Sterile prep,hand hygiene and sterile gloves were used. Minimal sedation used for procedure.   No paresthesia endorsed by patient during the procedure.  Negative aspiration and negative test dose prior to incremental administration of local anesthetic. The patient tolerated the procedure well with no immediate complications.      

## 2017-10-13 NOTE — Discharge Instructions (Signed)
AMBULATORY SURGERY  DISCHARGE INSTRUCTIONS   1) The drugs that you were given will stay in your system until tomorrow so for the next 24 hours you should not:  A) Drive an automobile B) Make any legal decisions C) Drink any alcoholic beverage   2) You may resume regular meals tomorrow.  Today it is better to start with liquids and gradually work up to solid foods.  You may eat anything you prefer, but it is better to start with liquids, then soup and crackers, and gradually work up to solid foods.   3) Please notify your doctor immediately if you have any unusual bleeding, trouble breathing, redness and pain at the surgery site, drainage, fever, or pain not relieved by medication.    4) Additional Instructions:  Keep the splint clean and DRY until your follow-up appointment with Dr. Harlow Mares at [facility] [phone]        Please contact your physician with any problems or Same Day Surgery at [phone], Monday through Friday 6 am to 4 pm, or [place] at [facility] number at [phone].

## 2017-10-13 NOTE — Transfer of Care (Signed)
Immediate Anesthesia Transfer of Care Note  Patient: Erica Mccall  Procedure(s) Performed: OPEN REDUCTION INTERNAL FIXATION (ORIF) ANKLE FRACTURE (Left Ankle)  Patient Location: PACU  Anesthesia Type:General  Level of Consciousness: awake and sedated  Airway & Oxygen Therapy: Patient Spontanous Breathing and Patient connected to face mask oxygen  Post-op Assessment: Report given to RN, Post -op Vital signs reviewed and stable and Patient moving all extremities  Post vital signs: Reviewed and stable  Last Vitals:  Vitals:   10/13/17 1330 10/13/17 1339  BP: (!) 95/49 (!) 99/40  Pulse: 70 69  Resp: 16 11  Temp:    SpO2: 100% 100%    Last Pain:  Vitals:   10/13/17 1339  TempSrc:   PainSc: 0-No pain         Complications: No apparent anesthesia complications

## 2017-10-13 NOTE — Op Note (Signed)
10/13/2017  PATIENT:  Erica Mccall    PRE-OPERATIVE DIAGNOSIS:  R83.094M Sprain of tibiofibular ligament of left ankle, init encntr S82.402S Unspecified fracture of shaft of left fibula, sequela  POST-OPERATIVE DIAGNOSIS:  Same  PROCEDURE:  OPEN REDUCTION INTERNAL FIXATION (ORIF) ANKLE FRACTURE, LEFT  SURGEON:  Lovell Sheehan, MD  ASSIST: Carlynn Spry, PA-C  ANESTHESIA:   General  TOURNIQUET TIME: 78  MIN @ 275 mmHg  PREOPERATIVE INDICATIONS:  [name] is a  46 y.o. female with a diagnosis of S93.432A Sprain of tibiofibular ligament of left ankle, init encntr S82.402S Unspecified fracture of shaft of left fibula, sequela who elected for surgical management to minimize the risk for malunion and nonunion and post-traumatic arthritis.    The risks benefits and alternatives were discussed with the patient preoperatively including but not limited to the risks of infection, bleeding, nerve injury, cardiopulmonary complications, the need for revision surgery, the need for hardware removal, among others, and the patient was willing to proceed.  OPERATIVE IMPLANTS: Arthrex TightRope x 2 and 2 hole plate  OPERATIVE PROCEDURE: The patient was brought to the operating room and placed in the supine position. All bony prominences were padded. General anesthesia was administered. The lower extremity was prepped and draped in the usual sterile fashion. The leg was elevated and exsanguinated and the tourniquet was inflated. Time out was performed.   Incision was made over the distal fibula. An olive wire was placed throught the two hole plate and the position was verified with fluoroscopy. The ankle was placed in dorsiflexion and the syndesmosis was reduced and verified using the Fluoroscan. The TightRope was cinched in to place, followed by a second TightRope. The wound was irrigated and closed with vicryl sutures and staples.  The syndesmosis was stressed using live fluoroscopy and found to be  stable.   Sponge and needle counts were correct. The wounds were injected with local anesthetic. Sterile gauze was applied followed by a posterior splint. She was awakened and returned to the PACU in stable and satisfactory condition. There were no apparent complications.  Elyn Aquas. Harlow Mares, MD

## 2017-10-13 NOTE — Anesthesia Post-op Follow-up Note (Signed)
Anesthesia QCDR form completed.        

## 2017-10-14 ENCOUNTER — Encounter: Payer: Self-pay | Admitting: Orthopedic Surgery

## 2017-10-14 ENCOUNTER — Inpatient Hospital Stay: Admit: 2017-10-14 | Payer: BLUE CROSS/BLUE SHIELD | Admitting: Orthopedic Surgery

## 2017-10-18 NOTE — Anesthesia Postprocedure Evaluation (Signed)
Anesthesia Post Note  Patient: Erica Mccall  Procedure(s) Performed: OPEN REDUCTION INTERNAL FIXATION (ORIF) ANKLE FRACTURE (Left Ankle)  Patient location during evaluation: PACU Anesthesia Type: General Level of consciousness: awake and alert Pain management: pain level controlled Vital Signs Assessment: post-procedure vital signs reviewed and stable Respiratory status: spontaneous breathing, nonlabored ventilation, respiratory function stable and patient connected to nasal cannula oxygen Cardiovascular status: blood pressure returned to baseline and stable Postop Assessment: no apparent nausea or vomiting Anesthetic complications: no     Last Vitals:  Vitals:   10/13/17 1628 10/13/17 1716  BP: 123/85 126/77  Pulse: 68 (!) 58  Resp: 16 16  Temp: 37.2 C   SpO2: 98% 99%    Last Pain:  Vitals:   10/14/17 1007  TempSrc:   PainSc: 0-No pain                 Wiktoria Hemrick S

## 2017-10-21 ENCOUNTER — Ambulatory Visit: Payer: BLUE CROSS/BLUE SHIELD | Attending: Obstetrics and Gynecology

## 2017-10-25 DIAGNOSIS — S93432A Sprain of tibiofibular ligament of left ankle, initial encounter: Secondary | ICD-10-CM | POA: Diagnosis not present

## 2017-10-25 DIAGNOSIS — S82402S Unspecified fracture of shaft of left fibula, sequela: Secondary | ICD-10-CM | POA: Diagnosis not present

## 2017-10-26 ENCOUNTER — Ambulatory Visit: Payer: Self-pay | Admitting: Obstetrics and Gynecology

## 2017-11-23 DIAGNOSIS — S82402S Unspecified fracture of shaft of left fibula, sequela: Secondary | ICD-10-CM | POA: Diagnosis not present

## 2017-11-23 DIAGNOSIS — S93401D Sprain of unspecified ligament of right ankle, subsequent encounter: Secondary | ICD-10-CM | POA: Diagnosis not present

## 2017-12-13 ENCOUNTER — Ambulatory Visit (INDEPENDENT_AMBULATORY_CARE_PROVIDER_SITE_OTHER): Payer: BLUE CROSS/BLUE SHIELD | Admitting: Obstetrics and Gynecology

## 2017-12-13 ENCOUNTER — Encounter: Payer: Self-pay | Admitting: Obstetrics and Gynecology

## 2017-12-13 VITALS — BP 130/80 | HR 87 | Ht 66.0 in | Wt 232.0 lb

## 2017-12-13 DIAGNOSIS — Z01419 Encounter for gynecological examination (general) (routine) without abnormal findings: Secondary | ICD-10-CM | POA: Diagnosis not present

## 2017-12-13 DIAGNOSIS — Z803 Family history of malignant neoplasm of breast: Secondary | ICD-10-CM

## 2017-12-13 DIAGNOSIS — Z1231 Encounter for screening mammogram for malignant neoplasm of breast: Secondary | ICD-10-CM | POA: Diagnosis not present

## 2017-12-13 DIAGNOSIS — Z1239 Encounter for other screening for malignant neoplasm of breast: Secondary | ICD-10-CM

## 2017-12-13 HISTORY — DX: Family history of malignant neoplasm of breast: Z80.3

## 2017-12-13 NOTE — Progress Notes (Signed)
 PCP:  Khan, Fozia M, MD   Chief Complaint  Patient presents with  . Gynecologic Exam     HPI:      [name] is a 47 y.o. G2P2002 who LMP was No LMP recorded. Patient has had a hysterectomy., presents today for her annual examination.  Her menses are absent due to hyst. She does not have intermenstrual bleeding. Vag sx from 10/18 resolved.   Sex activity: single partner, contraception - status post hysterectomy.  Last Pap: 08/22/15  Results were: no abnormalities /neg HPV DNA  Hx of STDs: none  Last mammogram: October 14, 2016  Results were: normal--routine follow-up in 12 months There is a FH of breast cancer in her pat aunt. Pt is BRCA neg 2014, IBIS=16.8%. Pt declined update testing last yr. There is no FH of ovarian cancer. The patient does do self-breast exams.  Tobacco use: The patient denies current or previous tobacco use. Alcohol use: none No drug use.  Exercise: moderately active  She does get adequate calcium and Vitamin D in her diet.  Labs with PCP.    Past Medical History:  Diagnosis Date  . Anxiety   . Anxiety   . Endometriosis   . Leiomyoma of uterus   . Menorrhagia   . Seasonal allergies   . Vitamin D deficiency     Past Surgical History:  Procedure Laterality Date  . ABDOMINAL HYSTERECTOMY  2006   partial, still has ovaries  . COLONOSCOPY    . ENDOMETRIAL BIOPSY    . ESOPHAGOGASTRODUODENOSCOPY    . HYSTEROSCOPY    . LEG SURGERY  10/13/2017  . ORIF ANKLE FRACTURE Left 10/13/2017   Procedure: OPEN REDUCTION INTERNAL FIXATION (ORIF) ANKLE FRACTURE;  Surgeon: Bowers, James R, MD;  Location: [facility] ORS;  Service: Orthopedics;  Laterality: Left;  . TOTAL ABDOMINAL HYSTERECTOMY      Family History  Problem Relation Age of Onset  . Breast cancer Paternal Aunt 44  . Diabetes Paternal Aunt   . Colon cancer Maternal Grandmother   . Lung cancer Maternal Grandfather   . Prostate cancer Maternal Grandfather   . Diabetes Paternal Grandmother      Social History   Socioeconomic History  . Marital status: Divorced    Spouse name: Not on file  . Number of children: Not on file  . Years of education: Not on file  . Highest education level: Not on file  Social Needs  . Financial resource strain: Not on file  . Food insecurity - worry: Not on file  . Food insecurity - inability: Not on file  . Transportation needs - medical: Not on file  . Transportation needs - non-medical: Not on file  Occupational History  . Not on file  Tobacco Use  . Smoking status: Never Smoker  . Smokeless tobacco: Never Used  Substance and Sexual Activity  . Alcohol use: Yes    Comment: occasionally  . Drug use: No  . Sexual activity: Yes    Birth control/protection: Surgical  Other Topics Concern  . Not on file  Social History Narrative  . Not on file    Current Meds  Medication Sig  . Cholecalciferol (VITAMIN D3) 50000 units CAPS Take 50,000 Units every Thursday by mouth.   . citalopram (CELEXA) 20 MG tablet Take 20 mg by mouth daily.  . docusate sodium (COLACE) 100 MG capsule Take 1 capsule (100 mg total) daily as needed by mouth.  . ibuprofen (ADVIL,MOTRIN) 200 MG tablet   Take 400 mg every 6 (six) hours as needed by mouth for headache.  . montelukast (SINGULAIR) 10 MG tablet Take 10 mg at bedtime by mouth.   . Probiotic CAPS Take 1 capsule daily by mouth.     ROS:  Review of Systems  Constitutional: Negative for fatigue, fever and unexpected weight change.  Respiratory: Negative for cough, shortness of breath and wheezing.   Cardiovascular: Negative for chest pain, palpitations and leg swelling.  Gastrointestinal: Negative for blood in stool, constipation, diarrhea, nausea and vomiting.  Endocrine: Negative for cold intolerance, heat intolerance and polyuria.  Genitourinary: Negative for dyspareunia, dysuria, flank pain, frequency, genital sores, hematuria, menstrual problem, pelvic pain, urgency, vaginal bleeding, vaginal  discharge and vaginal pain.  Musculoskeletal: Negative for back pain, joint swelling and myalgias.  Skin: Negative for rash.  Neurological: Negative for dizziness, syncope, light-headedness, numbness and headaches.  Hematological: Negative for adenopathy.  Psychiatric/Behavioral: Negative for agitation, confusion, sleep disturbance and suicidal ideas. The patient is not nervous/anxious.      Objective: BP 130/80   Pulse 87   Ht 5' 6" (1.676 m)   Wt 232 lb (105.2 kg)   BMI 37.45 kg/m    Physical Exam  Constitutional: She is oriented to person, place, and time. She appears well-developed and well-nourished.  Genitourinary: Vagina normal and uterus normal. There is no rash, tenderness, lesion or Bartholin's cyst on the right labia. There is no rash, tenderness, lesion or Bartholin's cyst on the left labia. No erythema, tenderness or bleeding in the vagina. No vaginal discharge found. Right adnexum does not display mass and does not display tenderness. Left adnexum does not display mass and does not display tenderness. Cervix does not exhibit motion tenderness, discharge, friability or polyp. Uterus is not enlarged or tender.  Neck: Normal range of motion. No thyromegaly present.  Cardiovascular: Normal rate, regular rhythm and normal heart sounds.  No murmur heard. Pulmonary/Chest: Effort normal and breath sounds normal. Right breast exhibits no mass, no nipple discharge, no skin change and no tenderness. Left breast exhibits no mass, no nipple discharge, no skin change and no tenderness.  Abdominal: Soft. There is no tenderness. There is no guarding.  Musculoskeletal: Normal range of motion.  Neurological: She is alert and oriented to person, place, and time. No cranial nerve deficit.  Psychiatric: She has a normal mood and affect. Her behavior is normal.  Nursing note and vitals reviewed.   Assessment/Plan: Encounter for annual routine gynecological examination  Screening for breast  cancer - Pt to sched mammo. Order already placed.  Family history of breast cancer - Pt is BRCA neg. MyRisk update testing discussed but pt declines.   GYN counsel breast self exam, mammography screening, adequate intake of calcium and vitamin D, diet and exercise     F/U  Return in about 1 year (around 12/13/2018).  Alicia B. Copland, PA-C 12/13/2017 1:54 PM 

## 2017-12-13 NOTE — Patient Instructions (Signed)
I value your feedback and entrusting us with your care. If you get a [facility] patient survey, I would appreciate you taking the time to let us know about your experience today. Thank you!  [facility]: [phone]    

## 2018-01-04 DIAGNOSIS — S82402S Unspecified fracture of shaft of left fibula, sequela: Secondary | ICD-10-CM | POA: Diagnosis not present

## 2018-01-04 DIAGNOSIS — S93402D Sprain of unspecified ligament of left ankle, subsequent encounter: Secondary | ICD-10-CM | POA: Diagnosis not present

## 2018-02-07 ENCOUNTER — Ambulatory Visit: Payer: BLUE CROSS/BLUE SHIELD | Admitting: Internal Medicine

## 2018-02-07 ENCOUNTER — Encounter: Payer: Self-pay | Admitting: Internal Medicine

## 2018-02-07 VITALS — BP 118/80 | HR 74 | Temp 98.1°F | Ht 65.0 in | Wt 235.6 lb

## 2018-02-07 DIAGNOSIS — Z1329 Encounter for screening for other suspected endocrine disorder: Secondary | ICD-10-CM

## 2018-02-07 DIAGNOSIS — R600 Localized edema: Secondary | ICD-10-CM | POA: Diagnosis not present

## 2018-02-07 DIAGNOSIS — Z0000 Encounter for general adult medical examination without abnormal findings: Secondary | ICD-10-CM | POA: Diagnosis not present

## 2018-02-07 DIAGNOSIS — Z13220 Encounter for screening for lipoid disorders: Secondary | ICD-10-CM

## 2018-02-07 DIAGNOSIS — Z1389 Encounter for screening for other disorder: Secondary | ICD-10-CM

## 2018-02-07 DIAGNOSIS — F419 Anxiety disorder, unspecified: Secondary | ICD-10-CM

## 2018-02-07 DIAGNOSIS — F32A Depression, unspecified: Secondary | ICD-10-CM

## 2018-02-07 DIAGNOSIS — G4700 Insomnia, unspecified: Secondary | ICD-10-CM | POA: Diagnosis not present

## 2018-02-07 DIAGNOSIS — E559 Vitamin D deficiency, unspecified: Secondary | ICD-10-CM | POA: Diagnosis not present

## 2018-02-07 DIAGNOSIS — Z1231 Encounter for screening mammogram for malignant neoplasm of breast: Secondary | ICD-10-CM | POA: Diagnosis not present

## 2018-02-07 HISTORY — DX: Localized edema: R60.0

## 2018-02-07 HISTORY — DX: Depression, unspecified: F32.A

## 2018-02-07 MED ORDER — TEMAZEPAM 15 MG PO CAPS: 15.0000 mg | ORAL_CAPSULE | Freq: Every evening | ORAL | 2 refills | Status: DC | PRN

## 2018-02-07 NOTE — Progress Notes (Signed)
Pre visit review using our clinic review tool, if applicable. No additional management support is needed unless otherwise documented below in the visit note. 

## 2018-02-07 NOTE — Patient Instructions (Addendum)
Please sch fasting labs f/u in 3 months sooner if needed  Take care   Constipation, Adult Constipation is when a person has fewer bowel movements in a week than normal, has difficulty having a bowel movement, or has stools that are dry, hard, or larger than normal. Constipation may be caused by an underlying condition. It may become worse with age if a person takes certain medicines and does not take in enough fluids. Follow these instructions at home: Eating and drinking   Eat foods that have a lot of fiber, such as fresh fruits and vegetables, whole grains, and beans.  Limit foods that are high in fat, low in fiber, or overly processed, such as french fries, hamburgers, cookies, candies, and soda.  Drink enough fluid to keep your urine clear or pale yellow. General instructions  Exercise regularly or as told by your health care provider.  Go to the restroom when you have the urge to go. Do not hold it in.  Take over-the-counter and prescription medicines only as told by your health care provider. These include any fiber supplements.  Practice pelvic floor retraining exercises, such as deep breathing while relaxing the lower abdomen and pelvic floor relaxation during bowel movements.  Watch your condition for any changes.  Keep all follow-up visits as told by your health care provider. This is important. Contact a health care provider if:  You have pain that gets worse.  You have a fever.  You do not have a bowel movement after 4 days.  You vomit.  You are not hungry.  You lose weight.  You are bleeding from the anus.  You have thin, pencil-like stools. Get help right away if:  You have a fever and your symptoms suddenly get worse.  You leak stool or have blood in your stool.  Your abdomen is bloated.  You have severe pain in your abdomen.  You feel dizzy or you faint. This information is not intended to replace advice given to you by your health care provider.  Make sure you discuss any questions you have with your health care provider. Document Released: 08/21/2004 Document Revised: 06/12/2016 Document Reviewed: 05/13/2016 Elsevier Interactive Patient Education  2018 [employer].  Insomnia Insomnia is a sleep disorder that makes it difficult to fall asleep or to stay asleep. Insomnia can cause tiredness (fatigue), low energy, difficulty concentrating, mood swings, and poor performance at work or school. There are three different ways to classify insomnia:  Difficulty falling asleep.  Difficulty staying asleep.  Waking up too early in the morning.  Any type of insomnia can be long-term (chronic) or short-term (acute). Both are common. Short-term insomnia usually lasts for three months or less. Chronic insomnia occurs at least three times a week for longer than three months. What are the causes? Insomnia may be caused by another condition, situation, or substance, such as:  Anxiety.  Certain medicines.  Gastroesophageal reflux disease (GERD) or other gastrointestinal conditions.  Asthma or other breathing conditions.  Restless legs syndrome, sleep apnea, or other sleep disorders.  Chronic pain.  Menopause. This may include hot flashes.  Stroke.  Abuse of alcohol, tobacco, or illegal drugs.  Depression.  Caffeine.  Neurological disorders, such as Alzheimer disease.  An overactive thyroid (hyperthyroidism).  The cause of insomnia may not be known. What increases the risk? Risk factors for insomnia include:  Gender. Women are more commonly affected than men.  Age. Insomnia is more common as you get older.  Stress. This may  involve your professional or personal life.  Income. Insomnia is more common in people with lower income.  Lack of exercise.  Irregular work schedule or night shifts.  Traveling between different time zones.  What are the signs or symptoms? If you have insomnia, trouble falling asleep or  trouble staying asleep is the main symptom. This may lead to other symptoms, such as:  Feeling fatigued.  Feeling nervous about going to sleep.  Not feeling rested in the morning.  Having trouble concentrating.  Feeling irritable, anxious, or depressed.  How is this treated? Treatment for insomnia depends on the cause. If your insomnia is caused by an underlying condition, treatment will focus on addressing the condition. Treatment may also include:  Medicines to help you sleep.  Counseling or therapy.  Lifestyle adjustments.  Follow these instructions at home:  Take medicines only as directed by your health care provider.  Keep regular sleeping and waking hours. Avoid naps.  Keep a sleep diary to help you and your health care provider figure out what could be causing your insomnia. Include: ? When you sleep. ? When you wake up during the night. ? How well you sleep. ? How rested you feel the next day. ? Any side effects of medicines you are taking. ? What you eat and drink.  Make your bedroom a comfortable place where it is easy to fall asleep: ? Put up shades or special blackout curtains to block light from outside. ? Use a white noise machine to block noise. ? Keep the temperature cool.  Exercise regularly as directed by your health care provider. Avoid exercising right before bedtime.  Use relaxation techniques to manage stress. Ask your health care provider to suggest some techniques that may work well for you. These may include: ? Breathing exercises. ? Routines to release muscle tension. ? Visualizing peaceful scenes.  Cut back on alcohol, caffeinated beverages, and cigarettes, especially close to bedtime. These can disrupt your sleep.  Do not overeat or eat spicy foods right before bedtime. This can lead to digestive discomfort that can make it hard for you to sleep.  Limit screen use before bedtime. This includes: ? Watching TV. ? Using your smartphone,  tablet, and computer.  Stick to a routine. This can help you fall asleep faster. Try to do a quiet activity, brush your teeth, and go to bed at the same time each night.  Get out of bed if you are still awake after 15 minutes of trying to sleep. Keep the lights down, but try reading or doing a quiet activity. When you feel sleepy, go back to bed.  Make sure that you drive carefully. Avoid driving if you feel very sleepy.  Keep all follow-up appointments as directed by your health care provider. This is important. Contact a health care provider if:  You are tired throughout the day or have trouble in your daily routine due to sleepiness.  You continue to have sleep problems or your sleep problems get worse. Get help right away if:  You have serious thoughts about hurting yourself or someone else. This information is not intended to replace advice given to you by your health care provider. Make sure you discuss any questions you have with your health care provider. Document Released: 11/20/2000 Document Revised: 04/24/2016 Document Reviewed: 08/24/2014 Elsevier Interactive Patient Education  [employer].

## 2018-02-07 NOTE — Progress Notes (Signed)
Chief Complaint  Patient presents with  . New Patient (Initial Visit)   NP former PCP Dr. Lamonte Sakai cousin is pt Erica Mccall  1. C/o interruptted sleep tried melatonin in the past w/o help sleeping 5-6 hrs at night but not consecutive  2. H/o anxiety on celexa 20 mg qd  3. C/o constipation and red stools intermittently when constipated 4. Would like mammogram    Review of Systems  Constitutional: Negative for weight loss.  HENT: Negative for hearing loss.   Eyes: Negative for blurred vision.  Respiratory: Negative for shortness of breath.   Cardiovascular: Positive for leg swelling. Negative for chest pain.       +left ankle swelling s/p fracture   Gastrointestinal: Negative for abdominal pain.  Musculoskeletal: Positive for falls.       [address] 10/2017 fractured left ankle and leg   Skin: Negative for rash.  Neurological: Negative for headaches.  Psychiatric/Behavioral: The patient is nervous/anxious and has insomnia.    Past Medical History:  Diagnosis Date  . Allergy   . Anxiety   . Anxiety   . Blood in stool   . Chicken pox   . Endometriosis   . Headache   . Leiomyoma of uterus   . Menorrhagia   . Seasonal allergies   . UTI (urinary tract infection)   . Vitamin D deficiency    Past Surgical History:  Procedure Laterality Date  . ABDOMINAL HYSTERECTOMY  2006   partial, still has ovaries and cervix per pt [facility]   . COLONOSCOPY    . ENDOMETRIAL BIOPSY    . ESOPHAGOGASTRODUODENOSCOPY    . HYSTEROSCOPY    . LEG SURGERY  10/13/2017   s/p fall   . ORIF ANKLE FRACTURE Left 10/13/2017   Procedure: OPEN REDUCTION INTERNAL FIXATION (ORIF) ANKLE FRACTURE;  Surgeon: Lovell Sheehan, MD;  Location: [facility] ORS;  Service: Orthopedics;  Laterality: Left;  . TOTAL ABDOMINAL HYSTERECTOMY     Family History  Problem Relation Age of Onset  . Breast cancer Paternal Aunt 61  . Diabetes Paternal Aunt   . Colon cancer Maternal Grandmother   . Cancer Maternal Grandmother    ? type  . Lung cancer Maternal Grandfather   . Prostate cancer Maternal Grandfather   . Diabetes Paternal Grandmother   . Hypertension Mother   . Hypertension Brother    Social History   Socioeconomic History  . Marital status: Divorced    Spouse name: Not on file  . Number of children: Not on file  . Years of education: Not on file  . Highest education level: Not on file  Social Needs  . Financial resource strain: Not on file  . Food insecurity - worry: Not on file  . Food insecurity - inability: Not on file  . Transportation needs - medical: Not on file  . Transportation needs - non-medical: Not on file  Occupational History  . Not on file  Tobacco Use  . Smoking status: Never Smoker  . Smokeless tobacco: Never Used  Substance and Sexual Activity  . Alcohol use: Yes    Comment: occasionally  . Drug use: No  . Sexual activity: Yes    Birth control/protection: Surgical  Other Topics Concern  . Not on file  Social History Narrative   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    Wears seat belts    No guns    Current Meds  Medication Sig  . Cholecalciferol (  VITAMIN D3) 50000 units CAPS Take 50,000 Units every Thursday by mouth.   . citalopram (CELEXA) 20 MG tablet Take 20 mg by mouth daily.  Marland Kitchen ibuprofen (ADVIL,MOTRIN) 200 MG tablet Take 400 mg every 6 (six) hours as needed by mouth for headache.  . montelukast (SINGULAIR) 10 MG tablet Take 10 mg at bedtime by mouth.   . Probiotic CAPS Take 1 capsule daily by mouth.   Allergies  Allergen Reactions  . Iodine Hives  . Fish Allergy     Hives even to smell of fish  . Pollen Extract    No results found for this or any previous visit (from the past 2160 hour(s)). Objective  Body mass index is 39.21 kg/m. Wt Readings from Last 3 Encounters:  02/07/18 235 lb 9.6 oz (106.9 kg)  12/13/17 232 lb (105.2 kg)  10/13/17 224 lb (101.6 kg)   Temp Readings from Last 3 Encounters:  02/07/18 98.1 F (36.7 C)  (Oral)  10/13/17 99 F (37.2 C)  10/11/17 98 F (36.7 C) (Oral)   BP Readings from Last 3 Encounters:  02/07/18 118/80  12/13/17 130/80  10/13/17 126/77   Pulse Readings from Last 3 Encounters:  02/07/18 74  12/13/17 87  10/13/17 (!) 58   O2 sat room air 98%   Physical Exam  Constitutional: She is oriented to person, place, and time and well-developed, well-nourished, and in no distress. Vital signs are normal.  HENT:  Head: Normocephalic and atraumatic.  Mouth/Throat: Oropharynx is clear and moist and mucous membranes are normal.  Eyes: Conjunctivae are normal. Pupils are equal, round, and reactive to light.  Cardiovascular: Normal rate, regular rhythm and normal heart sounds.  Slight swelling lower leg leg s/p fracture 10/2017   Pulmonary/Chest: Effort normal and breath sounds normal.  Abdominal: Soft. Bowel sounds are normal. There is no tenderness.  Neurological: She is alert and oriented to person, place, and time. Gait normal.  Skin: Skin is warm, dry and intact.  Psychiatric: Mood, memory, affect and judgment normal.  Nursing note and vitals reviewed.   Assessment   1. Anxiety/insomnia 2. Constipation  3. Vit D def  4. Left lower leg edema s/p fracture and ORIF left ankle  5. HM Plan  1. Cont celexa 20 mg  Add restoril 15 mg qhs  2. Increase water and fiber intake given info  3. Check vit D and other labs CMET, CBC, lipid, UA, TSH, T4, declines hep B titer check or STD check  4. Pt will try OTC compression knee high  5. Declines flu shot  Tdap had 2015/2016 per pt  Declines hep B titer   See labs as above Pap [facility] had 12/2017 get records, get records [facility]  Referred mammogram 3d    Provider: Dr. Olivia Mackie McLean-Scocuzza-[facility]

## 2018-03-10 ENCOUNTER — Other Ambulatory Visit (INDEPENDENT_AMBULATORY_CARE_PROVIDER_SITE_OTHER): Payer: BLUE CROSS/BLUE SHIELD

## 2018-03-10 DIAGNOSIS — Z0000 Encounter for general adult medical examination without abnormal findings: Secondary | ICD-10-CM

## 2018-03-10 DIAGNOSIS — Z1389 Encounter for screening for other disorder: Secondary | ICD-10-CM

## 2018-03-10 DIAGNOSIS — Z13220 Encounter for screening for lipoid disorders: Secondary | ICD-10-CM

## 2018-03-10 DIAGNOSIS — E559 Vitamin D deficiency, unspecified: Secondary | ICD-10-CM | POA: Diagnosis not present

## 2018-03-10 DIAGNOSIS — Z1329 Encounter for screening for other suspected endocrine disorder: Secondary | ICD-10-CM

## 2018-03-10 DIAGNOSIS — F419 Anxiety disorder, unspecified: Secondary | ICD-10-CM

## 2018-03-10 LAB — CBC WITH DIFFERENTIAL/PLATELET
BASOS PCT: 1 % (ref 0.0–3.0)
Basophils Absolute: 0 10*3/uL (ref 0.0–0.1)
EOS ABS: 0.3 10*3/uL (ref 0.0–0.7)
EOS PCT: 8.2 % — AB (ref 0.0–5.0)
HEMATOCRIT: 39.8 % (ref 36.0–46.0)
HEMOGLOBIN: 13.2 g/dL (ref 12.0–15.0)
LYMPHS PCT: 45.8 % (ref 12.0–46.0)
Lymphs Abs: 1.6 10*3/uL (ref 0.7–4.0)
MCHC: 33.1 g/dL (ref 30.0–36.0)
MCV: 94 fl (ref 78.0–100.0)
MONO ABS: 0.3 10*3/uL (ref 0.1–1.0)
Monocytes Relative: 9 % (ref 3.0–12.0)
NEUTROS ABS: 1.2 10*3/uL — AB (ref 1.4–7.7)
Neutrophils Relative %: 36 % — ABNORMAL LOW (ref 43.0–77.0)
PLATELETS: 258 10*3/uL (ref 150.0–400.0)
RBC: 4.23 Mil/uL (ref 3.87–5.11)
RDW: 12.6 % (ref 11.5–15.5)
WBC: 3.4 10*3/uL — AB (ref 4.0–10.5)

## 2018-03-10 LAB — COMPREHENSIVE METABOLIC PANEL
ALBUMIN: 3.6 g/dL (ref 3.5–5.2)
ALT: 18 U/L (ref 0–35)
AST: 19 U/L (ref 0–37)
Alkaline Phosphatase: 82 U/L (ref 39–117)
BUN: 15 mg/dL (ref 6–23)
CALCIUM: 8.9 mg/dL (ref 8.4–10.5)
CHLORIDE: 104 meq/L (ref 96–112)
CO2: 25 meq/L (ref 19–32)
Creatinine, Ser: 0.75 mg/dL (ref 0.40–1.20)
GFR: 106.69 mL/min (ref >60.00)
Glucose, Bld: 79 mg/dL (ref 70–99)
POTASSIUM: 3.7 meq/L (ref 3.5–5.1)
Sodium: 137 mEq/L (ref 135–145)
Total Bilirubin: 0.4 mg/dL (ref 0.2–1.2)
Total Protein: 7.4 g/dL (ref 6.0–8.3)

## 2018-03-10 LAB — URINALYSIS, ROUTINE W REFLEX MICROSCOPIC
Bilirubin Urine: NEGATIVE
Ketones, ur: NEGATIVE
Leukocytes, UA: NEGATIVE
Nitrite: NEGATIVE
Total Protein, Urine: NEGATIVE
URINE GLUCOSE: NEGATIVE
UROBILINOGEN UA: 0.2 (ref 0.0–1.0)
pH: 5.5 (ref 5.0–8.0)

## 2018-03-10 LAB — LIPID PANEL
CHOLESTEROL: 146 mg/dL (ref 0–200)
HDL: 40.6 mg/dL (ref >39.00)
LDL CALC: 88 mg/dL (ref 0–99)
NonHDL: 105.19
TRIGLYCERIDES: 87 mg/dL (ref 0.0–149.0)
Total CHOL/HDL Ratio: 4
VLDL: 17.4 mg/dL (ref 0.0–40.0)

## 2018-03-10 LAB — T4, FREE: Free T4: 0.59 ng/dL — ABNORMAL LOW (ref 0.60–1.60)

## 2018-03-10 LAB — VITAMIN D 25 HYDROXY (VIT D DEFICIENCY, FRACTURES): VITD: 41.52 ng/mL (ref 30.00–100.00)

## 2018-03-10 LAB — TSH: TSH: 1.23 u[IU]/mL (ref 0.35–4.50)

## 2018-03-14 ENCOUNTER — Encounter: Payer: Self-pay | Admitting: *Deleted

## 2018-03-23 ENCOUNTER — Other Ambulatory Visit: Payer: Self-pay | Admitting: Internal Medicine

## 2018-03-23 DIAGNOSIS — R319 Hematuria, unspecified: Secondary | ICD-10-CM

## 2018-03-29 ENCOUNTER — Other Ambulatory Visit: Payer: BLUE CROSS/BLUE SHIELD

## 2018-03-29 DIAGNOSIS — R319 Hematuria, unspecified: Secondary | ICD-10-CM | POA: Diagnosis not present

## 2018-03-30 LAB — URINALYSIS, ROUTINE W REFLEX MICROSCOPIC
Bilirubin, UA: NEGATIVE
GLUCOSE, UA: NEGATIVE
KETONES UA: NEGATIVE
LEUKOCYTES UA: NEGATIVE
Nitrite, UA: NEGATIVE
Protein, UA: NEGATIVE
RBC, UA: NEGATIVE
SPEC GRAV UA: 1.024 (ref 1.005–1.030)
Urobilinogen, Ur: 0.2 mg/dL (ref 0.2–1.0)
pH, UA: 5 (ref 5.0–7.5)

## 2018-05-10 ENCOUNTER — Ambulatory Visit (INDEPENDENT_AMBULATORY_CARE_PROVIDER_SITE_OTHER): Payer: BLUE CROSS/BLUE SHIELD | Admitting: Internal Medicine

## 2018-05-10 ENCOUNTER — Encounter: Payer: Self-pay | Admitting: Internal Medicine

## 2018-05-10 VITALS — BP 122/80 | HR 75 | Temp 98.9°F | Ht 65.0 in | Wt 235.8 lb

## 2018-05-10 DIAGNOSIS — J0120 Acute ethmoidal sinusitis, unspecified: Secondary | ICD-10-CM

## 2018-05-10 DIAGNOSIS — G4700 Insomnia, unspecified: Secondary | ICD-10-CM

## 2018-05-10 DIAGNOSIS — R7989 Other specified abnormal findings of blood chemistry: Secondary | ICD-10-CM | POA: Diagnosis not present

## 2018-05-10 DIAGNOSIS — Z1231 Encounter for screening mammogram for malignant neoplasm of breast: Secondary | ICD-10-CM

## 2018-05-10 DIAGNOSIS — D72819 Decreased white blood cell count, unspecified: Secondary | ICD-10-CM

## 2018-05-10 DIAGNOSIS — F419 Anxiety disorder, unspecified: Secondary | ICD-10-CM

## 2018-05-10 DIAGNOSIS — E669 Obesity, unspecified: Secondary | ICD-10-CM | POA: Diagnosis not present

## 2018-05-10 MED ORDER — AZITHROMYCIN 250 MG PO TABS: ORAL_TABLET | ORAL | 0 refills | Status: DC

## 2018-05-10 MED ORDER — FLUCONAZOLE 150 MG PO TABS
150.0000 mg | ORAL_TABLET | Freq: Once | ORAL | 0 refills | Status: AC
Start: 2018-05-10 — End: 2018-05-10

## 2018-05-10 MED ORDER — TEMAZEPAM 15 MG PO CAPS
15.0000 mg | ORAL_CAPSULE | Freq: Every evening | ORAL | 2 refills | Status: DC | PRN
Start: 2018-05-10 — End: 2018-08-22

## 2018-05-10 MED ORDER — PHENTERMINE HCL 37.5 MG PO TABS: 37.5000 mg | ORAL_TABLET | Freq: Every day | ORAL | 0 refills | Status: DC

## 2018-05-10 MED ORDER — CITALOPRAM HYDROBROMIDE 40 MG PO TABS: 40.0000 mg | ORAL_TABLET | Freq: Every day | ORAL | 1 refills | Status: DC

## 2018-05-10 NOTE — Patient Instructions (Addendum)
F/u in 1 month  Take care   Generalized Anxiety Disorder, Adult Generalized anxiety disorder (GAD) is a mental health disorder. People with this condition constantly worry about everyday events. Unlike normal anxiety, worry related to GAD is not triggered by a specific event. These worries also do not fade or get better with time. GAD interferes with life functions, including relationships, work, and school. GAD can vary from mild to severe. People with severe GAD can have intense waves of anxiety with physical symptoms (panic attacks). What are the causes? The exact cause of GAD is not known. What increases the risk? This condition is more likely to develop in:  Women.  People who have a family history of anxiety disorders.  People who are very shy.  People who experience very stressful life events, such as the death of a loved one.  People who have a very stressful family environment.  What are the signs or symptoms? People with GAD often worry excessively about many things in their lives, such as their health and family. They may also be overly concerned about:  Doing well at work.  Being on time.  Natural disasters.  Friendships.  Physical symptoms of GAD include:  Fatigue.  Muscle tension or having muscle twitches.  Trembling or feeling shaky.  Being easily startled.  Feeling like your heart is pounding or racing.  Feeling out of breath or like you cannot take a deep breath.  Having trouble falling asleep or staying asleep.  Sweating.  Nausea, diarrhea, or irritable bowel syndrome (IBS).  Headaches.  Trouble concentrating or remembering facts.  Restlessness.  Irritability.  How is this diagnosed? Your health care provider can diagnose GAD based on your symptoms and medical history. You will also have a physical exam. The health care provider will ask specific questions about your symptoms, including how severe they are, when they started, and if they  come and go. Your health care provider may ask you about your use of alcohol or drugs, including prescription medicines. Your health care provider may refer you to a mental health specialist for further evaluation. Your health care provider will do a thorough examination and may perform additional tests to rule out other possible causes of your symptoms. To be diagnosed with GAD, a person must have anxiety that:  Is out of his or her control.  Affects several different aspects of his or her life, such as work and relationships.  Causes distress that makes him or her unable to take part in normal activities.  Includes at least three physical symptoms of GAD, such as restlessness, fatigue, trouble concentrating, irritability, muscle tension, or sleep problems.  Before your health care provider can confirm a diagnosis of GAD, these symptoms must be present more days than they are not, and they must last for six months or longer. How is this treated? The following therapies are usually used to treat GAD:  Medicine. Antidepressant medicine is usually prescribed for long-term daily control. Antianxiety medicines may be added in severe cases, especially when panic attacks occur.  Talk therapy (psychotherapy). Certain types of talk therapy can be helpful in treating GAD by providing support, education, and guidance. Options include: ? Cognitive behavioral therapy (CBT). People learn coping skills and techniques to ease their anxiety. They learn to identify unrealistic or negative thoughts and behaviors and to replace them with positive ones. ? Acceptance and commitment therapy (ACT). This treatment teaches people how to be mindful as a way to cope with unwanted thoughts  and feelings. ? Biofeedback. This process trains you to manage your body's response (physiological response) through breathing techniques and relaxation methods. You will work with a therapist while machines are used to monitor your  physical symptoms.  Stress management techniques. These include yoga, meditation, and exercise.  A mental health specialist can help determine which treatment is best for you. Some people see improvement with one type of therapy. However, other people require a combination of therapies. Follow these instructions at home:  Take over-the-counter and prescription medicines only as told by your health care provider.  Try to maintain a normal routine.  Try to anticipate stressful situations and allow extra time to manage them.  Practice any stress management or self-calming techniques as taught by your health care provider.  Do not punish yourself for setbacks or for not making progress.  Try to recognize your accomplishments, even if they are small.  Keep all follow-up visits as told by your health care provider. This is important. Contact a health care provider if:  Your symptoms do not get better.  Your symptoms get worse.  You have signs of depression, such as: ? A persistently sad, cranky, or irritable mood. ? Loss of enjoyment in activities that used to bring you joy. ? Change in weight or eating. ? Changes in sleeping habits. ? Avoiding friends or family members. ? Loss of energy for normal tasks. ? Feelings of guilt or worthlessness. Get help right away if:  You have serious thoughts about hurting yourself or others. If you ever feel like you may hurt yourself or others, or have thoughts about taking your own life, get help right away. You can go to your nearest emergency department or call:  Your local emergency services (911 in the [place]  A suicide crisis helpline, such as the [facility] at (412)800-2565. This is open 24 hours a day.  Summary  Generalized anxiety disorder (GAD) is a mental health disorder that involves worry that is not triggered by a specific event.  People with GAD often worry excessively about many things in their  lives, such as their health and family.  GAD may cause physical symptoms such as restlessness, trouble concentrating, sleep problems, frequent sweating, nausea, diarrhea, headaches, and trembling or muscle twitching.  A mental health specialist can help determine which treatment is best for you. Some people see improvement with one type of therapy. However, other people require a combination of therapies. This information is not intended to replace advice given to you by your health care provider. Make sure you discuss any questions you have with your health care provider. Document Released: 03/20/2013 Document Revised: 10/13/2016 Document Reviewed: 10/13/2016 Elsevier Interactive Patient Education  2018 [employer].  Sinusitis, Adult Sinusitis is soreness and inflammation of your sinuses. Sinuses are hollow spaces in the bones around your face. Your sinuses are located:  Around your eyes.  In the middle of your forehead.  Behind your nose.  In your cheekbones.  Your sinuses and nasal passages are lined with a stringy fluid (mucus). Mucus normally drains out of your sinuses. When your nasal tissues become inflamed or swollen, the mucus can become trapped or blocked so air cannot flow through your sinuses. This allows bacteria, viruses, and funguses to grow, which leads to infection. Sinusitis can develop quickly and last for 7?10 days (acute) or for more than 12 weeks (chronic). Sinusitis often develops after a cold. What are the causes? This condition is caused by anything that creates  swelling in the sinuses or stops mucus from draining, including:  Allergies.  Asthma.  Bacterial or viral infection.  Abnormally shaped bones between the nasal passages.  Nasal growths that contain mucus (nasal polyps).  Narrow sinus openings.  Pollutants, such as chemicals or irritants in the air.  A foreign object stuck in the nose.  A fungal infection. This is rare.  What increases the  risk? The following factors may make you more likely to develop this condition:  Having allergies or asthma.  Having had a recent cold or respiratory tract infection.  Having structural deformities or blockages in your nose or sinuses.  Having a weak immune system.  Doing a lot of swimming or diving.  Overusing nasal sprays.  Smoking.  What are the signs or symptoms? The main symptoms of this condition are pain and a feeling of pressure around the affected sinuses. Other symptoms include:  Upper toothache.  Earache.  Headache.  Bad breath.  Decreased sense of smell and taste.  A cough that may get worse at night.  Fatigue.  Fever.  Thick drainage from your nose. The drainage is often green and it may contain pus (purulent).  Stuffy nose or congestion.  Postnasal drip. This is when extra mucus collects in the throat or back of the nose.  Swelling and warmth over the affected sinuses.  Sore throat.  Sensitivity to light.  How is this diagnosed? This condition is diagnosed based on symptoms, a medical history, and a physical exam. To find out if your condition is acute or chronic, your health care provider may:  Look in your nose for signs of nasal polyps.  Tap over the affected sinus to check for signs of infection.  View the inside of your sinuses using an imaging device that has a light attached (endoscope).  If your health care provider suspects that you have chronic sinusitis, you may also:  Be tested for allergies.  Have a sample of mucus taken from your nose (nasal culture) and checked for bacteria.  Have a mucus sample examined to see if your sinusitis is related to an allergy.  If your sinusitis does not respond to treatment and it lasts longer than 8 weeks, you may have an MRI or CT scan to check your sinuses. These scans also help to determine how severe your infection is. In rare cases, a bone biopsy may be done to rule out more serious types  of fungal sinus disease. How is this treated? Treatment for sinusitis depends on the cause and whether your condition is chronic or acute. If a virus is causing your sinusitis, your symptoms will go away on their own within 10 days. You may be given medicines to relieve your symptoms, including:  Topical nasal decongestants. They shrink swollen nasal passages and let mucus drain from your sinuses.  Antihistamines. These drugs block inflammation that is triggered by allergies. This can help to ease swelling in your nose and sinuses.  Topical nasal corticosteroids. These are nasal sprays that ease inflammation and swelling in your nose and sinuses.  Nasal saline washes. These rinses can help to get rid of thick mucus in your nose.  If your condition is caused by bacteria, you will be given an antibiotic medicine. If your condition is caused by a fungus, you will be given an antifungal medicine. Surgery may be needed to correct underlying conditions, such as narrow nasal passages. Surgery may also be needed to remove polyps. Follow these instructions at home: Medicines  Take, use, or apply over-the-counter and prescription medicines only as told by your health care provider. These may include nasal sprays.  If you were prescribed an antibiotic medicine, take it as told by your health care provider. Do not stop taking the antibiotic even if you start to feel better. Hydrate and Humidify  Drink enough water to keep your urine clear or pale yellow. Staying hydrated will help to thin your mucus.  Use a cool mist humidifier to keep the humidity level in your home above 50%.  Inhale steam for 10-15 minutes, 3-4 times a day or as told by your health care provider. You can do this in the bathroom while a hot shower is running.  Limit your exposure to cool or dry air. Rest  Rest as much as possible.  Sleep with your head raised (elevated).  Make sure to get enough sleep each night. General  instructions  Apply a warm, moist washcloth to your face 3-4 times a day or as told by your health care provider. This will help with discomfort.  Wash your hands often with soap and water to reduce your exposure to viruses and other germs. If soap and water are not available, use hand sanitizer.  Do not smoke. Avoid being around people who are smoking (secondhand smoke).  Keep all follow-up visits as told by your health care provider. This is important. Contact a health care provider if:  You have a fever.  Your symptoms get worse.  Your symptoms do not improve within 10 days. Get help right away if:  You have a severe headache.  You have persistent vomiting.  You have pain or swelling around your face or eyes.  You have vision problems.  You develop confusion.  Your neck is stiff.  You have trouble breathing. This information is not intended to replace advice given to you by your health care provider. Make sure you discuss any questions you have with your health care provider. Document Released: 11/23/2005 Document Revised: 07/19/2016 Document Reviewed: 09/18/2015 Elsevier Interactive Patient Education  [employer].

## 2018-05-10 NOTE — Progress Notes (Signed)
Pre visit review using our clinic review tool, if applicable. No additional management support is needed unless otherwise documented below in the visit note. 

## 2018-05-10 NOTE — Progress Notes (Signed)
Chief Complaint  Patient presents with  . Follow-up   F/u  1. Sinus congestion and pressure x 3 days runny nose, sneezing, dry cough, post nasal drip, no fever, no sick contacts. Tried Alkaseltzer flonase and singulair  2. Obesity gaining wt not exercise or eating healthy  3. Anxiety increased due to a lot of family stressors on celexa 20 mg and restoril 15 helps with sleep needs refill   Review of Systems  Constitutional: Negative for weight loss.  HENT: Positive for congestion and sinus pain.   Eyes: Negative for blurred vision.  Respiratory: Positive for cough.   Cardiovascular: Negative for chest pain.  Skin: Negative for rash.  Neurological: Positive for headaches.  Psychiatric/Behavioral: Negative for depression. The patient is nervous/anxious.    Past Medical History:  Diagnosis Date  . Allergy   . Anxiety   . Anxiety   . Blood in stool   . Chicken pox   . Endometriosis   . Headache   . Leiomyoma of uterus   . Menorrhagia   . Seasonal allergies   . UTI (urinary tract infection)   . Vitamin D deficiency    Past Surgical History:  Procedure Laterality Date  . ABDOMINAL HYSTERECTOMY  2006   partial, still has ovaries and cervix per pt [facility]   . COLONOSCOPY    . ENDOMETRIAL BIOPSY    . ESOPHAGOGASTRODUODENOSCOPY    . HYSTEROSCOPY    . LEG SURGERY  10/13/2017   s/p fall   . ORIF ANKLE FRACTURE Left 10/13/2017   Procedure: OPEN REDUCTION INTERNAL FIXATION (ORIF) ANKLE FRACTURE;  Surgeon: Lovell Sheehan, MD;  Location: [facility] ORS;  Service: Orthopedics;  Laterality: Left;  . TOTAL ABDOMINAL HYSTERECTOMY     Family History  Problem Relation Age of Onset  . Breast cancer Paternal Aunt 54  . Diabetes Paternal Aunt   . Colon cancer Maternal Grandmother   . Cancer Maternal Grandmother        ? type  . Lung cancer Maternal Grandfather   . Prostate cancer Maternal Grandfather   . Diabetes Paternal Grandmother   . Hypertension Mother   . Hypertension Brother     Social History   Socioeconomic History  . Marital status: Divorced    Spouse name: Not on file  . Number of children: Not on file  . Years of education: Not on file  . Highest education level: Not on file  Occupational History  . Not on file  Social Needs  . Financial resource strain: Not on file  . Food insecurity:    Worry: Not on file    Inability: Not on file  . Transportation needs:    Medical: Not on file    Non-medical: Not on file  Tobacco Use  . Smoking status: Never Smoker  . Smokeless tobacco: Never Used  Substance and Sexual Activity  . Alcohol use: Yes    Comment: occasionally  . Drug use: No  . Sexual activity: Yes    Birth control/protection: Surgical  Lifestyle  . Physical activity:    Days per week: Not on file    Minutes per session: Not on file  . Stress: Not on file  Relationships  . Social connections:    Talks on phone: Not on file    Gets together: Not on file    Attends religious service: Not on file    Active member of club or organization: Not on file    Attends meetings of clubs or organizations:  Not on file    Relationship status: Not on file  . Intimate partner violence:    Fear of current or ex partner: Not on file    Emotionally abused: Not on file    Physically abused: Not on file    Forced sexual activity: Not on file  Other Topics Concern  . Not on file  Social History Narrative   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    Wears seat belts    No guns    Current Meds  Medication Sig  . Cholecalciferol (VITAMIN D3) 50000 units CAPS Take 50,000 Units every Thursday by mouth.   . citalopram (CELEXA) 40 MG tablet Take 1 tablet (40 mg total) by mouth daily.  Marland Kitchen ibuprofen (ADVIL,MOTRIN) 200 MG tablet Take 400 mg every 6 (six) hours as needed by mouth for headache.  . montelukast (SINGULAIR) 10 MG tablet Take 10 mg at bedtime by mouth.   . Probiotic CAPS Take 1 capsule daily by mouth.  . temazepam (RESTORIL) 15 MG  capsule Take 1 capsule (15 mg total) by mouth at bedtime as needed for sleep.  . [DISCONTINUED] citalopram (CELEXA) 20 MG tablet Take 20 mg by mouth daily.  . [DISCONTINUED] temazepam (RESTORIL) 15 MG capsule Take 1 capsule (15 mg total) by mouth at bedtime as needed for sleep.   Allergies  Allergen Reactions  . Iodine Hives  . Fish Allergy     Hives even to smell of fish  . Pollen Extract    Recent Results (from the past 2160 hour(s))  Vitamin D (25 hydroxy)     Status: None   Collection Time: 03/10/18  9:13 AM  Result Value Ref Range   VITD 41.52 30.00 - 100.00 ng/mL  TSH     Status: None   Collection Time: 03/10/18  9:13 AM  Result Value Ref Range   TSH 1.23 0.35 - 4.50 uIU/mL  T4, free     Status: Abnormal   Collection Time: 03/10/18  9:13 AM  Result Value Ref Range   Free T4 0.59 (L) 0.60 - 1.60 ng/dL    Comment: Specimens from patients who are undergoing biotin therapy and /or ingesting biotin supplements may contain high levels of biotin.  The higher biotin concentration in these specimens interferes with this Free T4 assay.  Specimens that contain high levels  of biotin may cause false high results for this Free T4 assay.  Please interpret results in light of the total clinical presentation of the patient.    Urinalysis, Routine w reflex microscopic     Status: Abnormal   Collection Time: 03/10/18  9:13 AM  Result Value Ref Range   Color, Urine YELLOW Yellow;Lt. Yellow   APPearance CLEAR Clear   Specific Gravity, Urine >=1.030 (A) 1.000 - 1.030   pH 5.5 5.0 - 8.0   Total Protein, Urine NEGATIVE Negative   Urine Glucose NEGATIVE Negative   Ketones, ur NEGATIVE Negative   Bilirubin Urine NEGATIVE Negative   Hgb urine dipstick MODERATE (A) Negative   Urobilinogen, UA 0.2 0.0 - 1.0   Leukocytes, UA NEGATIVE Negative   Nitrite NEGATIVE Negative   WBC, UA 0-2/hpf 0-2/hpf   RBC / HPF 3-6/hpf (A) 0-2/hpf   Squamous Epithelial / LPF Few(5-10/hpf) (A) Rare(0-4/hpf)    Bacteria, UA Few(10-50/hpf) (A) None  Lipid panel     Status: None   Collection Time: 03/10/18  9:13 AM  Result Value Ref Range   Cholesterol  146 0 - 200 mg/dL    Comment: ATP III Classification       Desirable:  < 200 mg/dL               Borderline High:  200 - 239 mg/dL          High:  > = 240 mg/dL   Triglycerides 87.0 0.0 - 149.0 mg/dL    Comment: Normal:  <150 mg/dLBorderline High:  150 - 199 mg/dL   HDL 40.60 >39.00 mg/dL   VLDL 17.4 0.0 - 40.0 mg/dL   LDL Cholesterol 88 0 - 99 mg/dL   Total CHOL/HDL Ratio 4     Comment:                Men          Women1/2 Average Risk     3.4          3.3Average Risk          5.0          4.42X Average Risk          9.6          7.13X Average Risk          15.0          11.0                       NonHDL 105.19     Comment: NOTE:  Non-HDL goal should be 30 mg/dL higher than patient's LDL goal (i.e. LDL goal of < 70 mg/dL, would have non-HDL goal of < 100 mg/dL)  CBC w/Diff     Status: Abnormal   Collection Time: 03/10/18  9:13 AM  Result Value Ref Range   WBC 3.4 (L) 4.0 - 10.5 K/uL   RBC 4.23 3.87 - 5.11 Mil/uL   Hemoglobin 13.2 12.0 - 15.0 g/dL   HCT 39.8 36.0 - 46.0 %   MCV 94.0 78.0 - 100.0 fl   MCHC 33.1 30.0 - 36.0 g/dL   RDW 12.6 11.5 - 15.5 %   Platelets 258.0 150.0 - 400.0 K/uL   Neutrophils Relative % 36.0 (L) 43.0 - 77.0 %   Lymphocytes Relative 45.8 12.0 - 46.0 %   Monocytes Relative 9.0 3.0 - 12.0 %   Eosinophils Relative 8.2 (H) 0.0 - 5.0 %   Basophils Relative 1.0 0.0 - 3.0 %   Neutro Abs 1.2 (L) 1.4 - 7.7 K/uL   Lymphs Abs 1.6 0.7 - 4.0 K/uL   Monocytes Absolute 0.3 0.1 - 1.0 K/uL   Eosinophils Absolute 0.3 0.0 - 0.7 K/uL   Basophils Absolute 0.0 0.0 - 0.1 K/uL  Comprehensive metabolic panel     Status: None   Collection Time: 03/10/18  9:13 AM  Result Value Ref Range   Sodium 137 135 - 145 mEq/L   Potassium 3.7 3.5 - 5.1 mEq/L   Chloride 104 96 - 112 mEq/L   CO2 25 19 - 32 mEq/L   Glucose, Bld 79 70 - 99 mg/dL   BUN  15 6 - 23 mg/dL   Creatinine, Ser 0.75 0.40 - 1.20 mg/dL   Total Bilirubin 0.4 0.2 - 1.2 mg/dL   Alkaline Phosphatase 82 39 - 117 U/L   AST 19 0 - 37 U/L   ALT 18 0 - 35 U/L   Total Protein 7.4 6.0 - 8.3 g/dL   Albumin 3.6 3.5 - 5.2 g/dL   Calcium 8.9 8.4 -  10.5 mg/dL   GFR 106.69 >60.00 mL/min  Urinalysis, Routine w reflex microscopic     Status: None   Collection Time: 03/29/18 10:28 AM  Result Value Ref Range   Specific Gravity, UA 1.024 1.005 - 1.030   pH, UA 5.0 5.0 - 7.5   Color, UA Yellow Yellow   Appearance Ur Clear Clear   Leukocytes, UA Negative Negative   Protein, UA Negative Negative/Trace   Glucose, UA Negative Negative   Ketones, UA Negative Negative   RBC, UA Negative Negative   Bilirubin, UA Negative Negative   Urobilinogen, Ur 0.2 0.2 - 1.0 mg/dL   Nitrite, UA Negative Negative   Microscopic Examination Comment     Comment: Microscopic not indicated and not performed.   Objective  Body mass index is 39.24 kg/m. Wt Readings from Last 3 Encounters:  05/10/18 235 lb 12.8 oz (107 kg)  02/07/18 235 lb 9.6 oz (106.9 kg)  12/13/17 232 lb (105.2 kg)   Temp Readings from Last 3 Encounters:  05/10/18 98.9 F (37.2 C) (Oral)  02/07/18 98.1 F (36.7 C) (Oral)  10/13/17 99 F (37.2 C)   BP Readings from Last 3 Encounters:  05/10/18 122/80  02/07/18 118/80  12/13/17 130/80   Pulse Readings from Last 3 Encounters:  05/10/18 75  02/07/18 74  12/13/17 87    Physical Exam  Constitutional: She is oriented to person, place, and time. Vital signs are normal. She appears well-developed and well-nourished. She is cooperative.  HENT:  Head: Normocephalic and atraumatic.  Nose: Right sinus exhibits no maxillary sinus tenderness and no frontal sinus tenderness. Left sinus exhibits no maxillary sinus tenderness and no frontal sinus tenderness.  Mouth/Throat: Oropharynx is clear and moist and mucous membranes are normal.  +ethmoid ttp   Eyes: Pupils are equal,  round, and reactive to light. Conjunctivae are normal.  Cardiovascular: Normal rate, regular rhythm and normal heart sounds.  Pulmonary/Chest: Effort normal and breath sounds normal.  Neurological: She is alert and oriented to person, place, and time. Gait normal.  Skin: Skin is warm, dry and intact.  Psychiatric: She has a normal mood and affect. Her speech is normal and behavior is normal. Judgment and thought content normal. Cognition and memory are normal.  Nursing note and vitals reviewed.   Assessment   1. Sinusitis  2. Obesity BMI 39.24  3. Anxiety uncontrolled/insomnia improved  4. Leukopenia  5. Abnormal t4 low  6. HM Plan   1. Zpack  Supportive care  2. rec exercise and healthy diet choices  adipex f/u in 1 month  3. Increase celexa 40 mg qd  Refilled restorial  4. Repeat at f/u  5. Recheck at f/u 6.  Declines flu shot  Tdap had 2015/2016 per pt  Declines hep B titer   See labs as above need to do at f/u  Pap [facility] had 12/2017 get records get records [facility]  Referred mammogram 3d again will sch for pt         Provider: Dr. Olivia Mackie McLean-Scocuzza-Internal Medicine

## 2018-05-13 ENCOUNTER — Telehealth: Payer: Self-pay | Admitting: Internal Medicine

## 2018-05-13 NOTE — Telephone Encounter (Signed)
ROI from [facility] received on 05/13/2018

## 2018-05-18 ENCOUNTER — Encounter: Payer: Self-pay | Admitting: Internal Medicine

## 2018-05-18 NOTE — Progress Notes (Signed)
Pap 08/22/15 neg neg HPV Dr. Fraser Din   Power

## 2018-06-13 ENCOUNTER — Encounter: Payer: Self-pay | Admitting: Internal Medicine

## 2018-06-13 ENCOUNTER — Ambulatory Visit: Payer: BLUE CROSS/BLUE SHIELD | Admitting: Internal Medicine

## 2018-06-13 ENCOUNTER — Telehealth: Payer: Self-pay | Admitting: Internal Medicine

## 2018-06-13 VITALS — BP 126/78 | HR 77 | Temp 98.5°F | Ht 65.0 in | Wt 223.0 lb

## 2018-06-13 DIAGNOSIS — D72819 Decreased white blood cell count, unspecified: Secondary | ICD-10-CM | POA: Diagnosis not present

## 2018-06-13 DIAGNOSIS — F419 Anxiety disorder, unspecified: Secondary | ICD-10-CM | POA: Diagnosis not present

## 2018-06-13 DIAGNOSIS — R946 Abnormal results of thyroid function studies: Secondary | ICD-10-CM | POA: Diagnosis not present

## 2018-06-13 DIAGNOSIS — G4700 Insomnia, unspecified: Secondary | ICD-10-CM | POA: Diagnosis not present

## 2018-06-13 DIAGNOSIS — Z0184 Encounter for antibody response examination: Secondary | ICD-10-CM

## 2018-06-13 DIAGNOSIS — E669 Obesity, unspecified: Secondary | ICD-10-CM

## 2018-06-13 HISTORY — DX: Abnormal results of thyroid function studies: R94.6

## 2018-06-13 MED ORDER — PHENTERMINE HCL 37.5 MG PO TABS: 37.5000 mg | ORAL_TABLET | Freq: Every day | ORAL | 1 refills | Status: DC

## 2018-06-13 NOTE — Progress Notes (Signed)
Pre visit review using our clinic review tool, if applicable. No additional management support is needed unless otherwise documented below in the visit note. 

## 2018-06-13 NOTE — Patient Instructions (Signed)
Weight loss goal of 215 in 2 months   Lentils, soy beans, chickpeas, Quinoa or [place]ro, Brown rice, multigrain products  [place], Chia seeds/flax seeds  Spinach, peas, corn, avacado  Asparagus  edamame  Sweet potato Less carbs (zucchini or squash past)   Make sure get 3 D mammogram   Mediterranean Diet A Mediterranean diet refers to food and lifestyle choices that are based on the traditions of countries located on the [employer]. This way of eating has been shown to help prevent certain conditions and improve outcomes for people who have chronic diseases, like kidney disease and heart disease. What are tips for following this plan? Lifestyle  Cook and eat meals together with your family, when possible.  Drink enough fluid to keep your urine clear or pale yellow.  Be physically active every day. This includes: ? Aerobic exercise like running or swimming. ? Leisure activities like gardening, walking, or housework.  Get 7-8 hours of sleep each night.  If recommended by your health care provider, drink red wine in moderation. This means 1 glass a day for nonpregnant women and 2 glasses a day for men. A glass of wine equals 5 oz (150 mL). Reading food labels  Check the serving size of packaged foods. For foods such as rice and pasta, the serving size refers to the amount of cooked product, not dry.  Check the total fat in packaged foods. Avoid foods that have saturated fat or trans fats.  Check the ingredients list for added sugars, such as corn syrup. Shopping  At the grocery store, buy most of your food from the areas near the walls of the store. This includes: ? Fresh fruits and vegetables (produce). ? Grains, beans, nuts, and seeds. Some of these may be available in unpackaged forms or large amounts (in bulk). ? Fresh seafood. ? Poultry and eggs. ? Low-fat dairy products.  Buy whole ingredients instead of prepackaged foods.  Buy fresh fruits and vegetables in-season  from local farmers markets.  Buy frozen fruits and vegetables in resealable bags.  If you do not have access to quality fresh seafood, buy precooked frozen shrimp or canned fish, such as tuna, salmon, or sardines.  Buy small amounts of raw or cooked vegetables, salads, or olives from the deli or salad bar at your store.  Stock your pantry so you always have certain foods on hand, such as olive oil, canned tuna, canned tomatoes, rice, pasta, and beans. Cooking  Cook foods with extra-virgin olive oil instead of using butter or other vegetable oils.  Have meat as a side dish, and have vegetables or grains as your main dish. This means having meat in small portions or adding small amounts of meat to foods like pasta or stew.  Use beans or vegetables instead of meat in common dishes like chili or lasagna.  Experiment with different cooking methods. Try roasting or broiling vegetables instead of steaming or sauteing them.  Add frozen vegetables to soups, stews, pasta, or rice.  Add nuts or seeds for added healthy fat at each meal. You can add these to yogurt, salads, or vegetable dishes.  Marinate fish or vegetables using olive oil, lemon juice, garlic, and fresh herbs. Meal planning  Plan to eat 1 vegetarian meal one day each week. Try to work up to 2 vegetarian meals, if possible.  Eat seafood 2 or more times a week.  Have healthy snacks readily available, such as: ? Vegetable sticks with hummus. ? [place] yogurt. ? Fruit  and nut trail mix.  Eat balanced meals throughout the week. This includes: ? Fruit: 2-3 servings a day ? Vegetables: 4-5 servings a day ? Low-fat dairy: 2 servings a day ? Fish, poultry, or lean meat: 1 serving a day ? Beans and legumes: 2 or more servings a week ? Nuts and seeds: 1-2 servings a day ? Whole grains: 6-8 servings a day ? Extra-virgin olive oil: 3-4 servings a day  Limit red meat and sweets to only a few servings a month What are my food  choices?  Mediterranean diet ? Recommended ? Grains: Whole-grain pasta. Brown rice. Bulgar wheat. Polenta. Couscous. Whole-wheat bread. [place]. ? Vegetables: Artichokes. Beets. Broccoli. Cabbage. Carrots. Eggplant. Green beans. Chard. Kale. Spinach. Onions. Leeks. Peas. Squash. Tomatoes. Peppers. Radishes. ? Fruits: Apples. Apricots. Avocado. Berries. Bananas. Cherries. Dates. Figs. Grapes. Lemons. Melon. Oranges. Peaches. Plums. Pomegranate. ? Meats and other protein foods: Beans. Almonds. Sunflower seeds. Pine nuts. Peanuts. [place]. Salmon. Scallops. Shrimp. [place]. Tilapia. Clams. Oysters. Eggs. ? Dairy: Low-fat milk. Cheese. Greek yogurt. ? Beverages: Water. Red wine. Herbal tea. ? Fats and oils: Extra virgin olive oil. Avocado oil. Grape seed oil. ? Sweets and desserts: [place] yogurt with honey. Baked apples. Poached pears. Trail mix. ? Seasoning and other foods: Basil. Cilantro. Coriander. Cumin. Mint. Parsley. Sage. Rosemary. Tarragon. Garlic. Oregano. Thyme. Pepper. Balsalmic vinegar. Tahini. Hummus. Tomato sauce. Olives. Mushrooms. ? Limit these ? Grains: Prepackaged pasta or rice dishes. Prepackaged cereal with added sugar. ? Vegetables: Deep fried potatoes (french fries). ? Fruits: Fruit canned in syrup. ? Meats and other protein foods: Beef. Pork. Lamb. Poultry with skin. Hot dogs. [place] Salines. ? Dairy: Ice cream. Sour cream. Whole milk. ? Beverages: Juice. Sugar-sweetened soft drinks. Beer. Liquor and spirits. ? Fats and oils: Butter. Canola oil. Vegetable oil. Beef fat (tallow). Lard. ? Sweets and desserts: Cookies. Cakes. Pies. Candy. ? Seasoning and other foods: Mayonnaise. Premade sauces and marinades. ? The items listed may not be a complete list. Talk with your dietitian about what dietary choices are right for you. Summary  The Mediterranean diet includes both food and lifestyle choices.  Eat a variety of fresh fruits and vegetables, beans, nuts, seeds, and whole  grains.  Limit the amount of red meat and sweets that you eat.  Talk with your health care provider about whether it is safe for you to drink red wine in moderation. This means 1 glass a day for nonpregnant women and 2 glasses a day for men. A glass of wine equals 5 oz (150 mL). This information is not intended to replace advice given to you by your health care provider. Make sure you discuss any questions you have with your health care provider. Document Released: 07/16/2016 Document Revised: 08/18/2016 Document Reviewed: 07/16/2016 Elsevier Interactive Patient Education  [employer].

## 2018-06-13 NOTE — Telephone Encounter (Signed)
We need to get pap [facility] from 12/2017 requested already previously call again and have fax asap please   [place]

## 2018-06-13 NOTE — Progress Notes (Signed)
Chief Complaint  Patient presents with  . Follow-up   F/u  1. Sinus issues resolved  2 .obesity she has lost wet since last visit 12 lbs per our scales changed diet and exercising tolerating medication w/o side effects  3. Abnormal thyroid tests will repeat today  4. Anxiety increased Celexa to 40 mg qd helping she is happy son in [place] coming home x 2 weeks soon he lives in [place]    Review of Systems  Constitutional: Positive for weight loss.  HENT: Negative for hearing loss.   Eyes: Negative for blurred vision.  Respiratory: Negative for shortness of breath.   Cardiovascular: Negative for chest pain.  Skin: Negative for rash.  Psychiatric/Behavioral: The patient is not nervous/anxious.    Past Medical History:  Diagnosis Date  . Allergy   . Anxiety   . Anxiety   . Blood in stool   . Chicken pox   . Endometriosis   . Headache   . Leiomyoma of uterus   . Menorrhagia   . Seasonal allergies   . UTI (urinary tract infection)   . Vitamin D deficiency    Past Surgical History:  Procedure Laterality Date  . ABDOMINAL HYSTERECTOMY  2006   partial, still has ovaries and cervix per pt [facility]   . COLONOSCOPY    . ENDOMETRIAL BIOPSY    . ESOPHAGOGASTRODUODENOSCOPY    . HYSTEROSCOPY    . LEG SURGERY  10/13/2017   s/p fall   . ORIF ANKLE FRACTURE Left 10/13/2017   Procedure: OPEN REDUCTION INTERNAL FIXATION (ORIF) ANKLE FRACTURE;  Surgeon: Lovell Sheehan, MD;  Location: [facility] ORS;  Service: Orthopedics;  Laterality: Left;  . TOTAL ABDOMINAL HYSTERECTOMY     Family History  Problem Relation Age of Onset  . Breast cancer Paternal Aunt 23  . Diabetes Paternal Aunt   . Colon cancer Maternal Grandmother   . Cancer Maternal Grandmother        ? type  . Lung cancer Maternal Grandfather   . Prostate cancer Maternal Grandfather   . Diabetes Paternal Grandmother   . Hypertension Mother   . Hypertension Brother    Social History   Socioeconomic History  . Marital status:  Divorced    Spouse name: Not on file  . Number of children: Not on file  . Years of education: Not on file  . Highest education level: Not on file  Occupational History  . Not on file  Social Needs  . Financial resource strain: Not on file  . Food insecurity:    Worry: Not on file    Inability: Not on file  . Transportation needs:    Medical: Not on file    Non-medical: Not on file  Tobacco Use  . Smoking status: Never Smoker  . Smokeless tobacco: Never Used  Substance and Sexual Activity  . Alcohol use: Yes    Comment: occasionally  . Drug use: No  . Sexual activity: Yes    Birth control/protection: Surgical  Lifestyle  . Physical activity:    Days per week: Not on file    Minutes per session: Not on file  . Stress: Not on file  Relationships  . Social connections:    Talks on phone: Not on file    Gets together: Not on file    Attends religious service: Not on file    Active member of club or organization: Not on file    Attends meetings of clubs or organizations: Not on file  Relationship status: Not on file  . Intimate partner violence:    Fear of current or ex partner: Not on file    Emotionally abused: Not on file    Physically abused: Not on file    Forced sexual activity: Not on file  Other Topics Concern  . Not on file  Social History Narrative   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    Wears seat belts    No guns    Current Meds  Medication Sig  . azithromycin (ZITHROMAX) 250 MG tablet 2 pills today 1 pill day 2-5  . Cholecalciferol (VITAMIN D3) 50000 units CAPS Take 50,000 Units every Thursday by mouth.   . citalopram (CELEXA) 40 MG tablet Take 1 tablet (40 mg total) by mouth daily.  Marland Kitchen ibuprofen (ADVIL,MOTRIN) 200 MG tablet Take 400 mg every 6 (six) hours as needed by mouth for headache.  . montelukast (SINGULAIR) 10 MG tablet Take 10 mg at bedtime by mouth.   . phentermine (ADIPEX-P) 37.5 MG tablet Take 1 tablet (37.5 mg total) by  mouth daily before breakfast.  . Probiotic CAPS Take 1 capsule daily by mouth.  . temazepam (RESTORIL) 15 MG capsule Take 1 capsule (15 mg total) by mouth at bedtime as needed for sleep.   Allergies  Allergen Reactions  . Iodine Hives  . Fish Allergy     Hives even to smell of fish  . Pollen Extract    Recent Results (from the past 2160 hour(s))  Urinalysis, Routine w reflex microscopic     Status: None   Collection Time: 03/29/18 10:28 AM  Result Value Ref Range   Specific Gravity, UA 1.024 1.005 - 1.030   pH, UA 5.0 5.0 - 7.5   Color, UA Yellow Yellow   Appearance Ur Clear Clear   Leukocytes, UA Negative Negative   Protein, UA Negative Negative/Trace   Glucose, UA Negative Negative   Ketones, UA Negative Negative   RBC, UA Negative Negative   Bilirubin, UA Negative Negative   Urobilinogen, Ur 0.2 0.2 - 1.0 mg/dL   Nitrite, UA Negative Negative   Microscopic Examination Comment     Comment: Microscopic not indicated and not performed.   Objective  Body mass index is 37.11 kg/m. Wt Readings from Last 3 Encounters:  06/13/18 223 lb (101.2 kg)  05/10/18 235 lb 12.8 oz (107 kg)  02/07/18 235 lb 9.6 oz (106.9 kg)   Temp Readings from Last 3 Encounters:  06/13/18 98.5 F (36.9 C) (Oral)  05/10/18 98.9 F (37.2 C) (Oral)  02/07/18 98.1 F (36.7 C) (Oral)   BP Readings from Last 3 Encounters:  06/13/18 126/78  05/10/18 122/80  02/07/18 118/80   Pulse Readings from Last 3 Encounters:  06/13/18 77  05/10/18 75  02/07/18 74    Physical Exam  Constitutional: She is oriented to person, place, and time. Vital signs are normal. She appears well-developed and well-nourished. She is cooperative.  HENT:  Head: Normocephalic and atraumatic.  Mouth/Throat: Oropharynx is clear and moist and mucous membranes are normal.  Eyes: Pupils are equal, round, and reactive to light. Conjunctivae are normal.  Cardiovascular: Normal rate, regular rhythm and normal heart sounds.   Pulmonary/Chest: Effort normal and breath sounds normal.  Neurological: She is alert and oriented to person, place, and time. Gait normal.  Skin: Skin is warm, dry and intact.  Psychiatric: She has a normal mood and affect. Her speech is normal and behavior is  normal. Judgment and thought content normal. Cognition and memory are normal.  Nursing note and vitals reviewed.   Assessment   1. Anxiety improved/insomnia 2. Abnormal thyroid labs  3. Obesity with wt loss BMI 37.11  4. Leukopenia  5.HM Plan   1. Cont celexa 40, cont restory  2. Repeat thyroid labs today complete  3. Cont exercise to lose  rx refill adipex x 2 months then f/u  4. Repeat CBC 5.  Declines flu shot  Tdap had 2015/2016 per pt  Declines hep B titer  Check MMR today   Pap [facility] had 12/2017 get records 2nd request  get records [facility]  Referred mammogram 3dagain pt reports seh will have at work 10/26/18 but will ask she make sure its 3d mammogram and fax here to [facility]     Provider: Dr. Olivia Mackie McLean-[facility]

## 2018-06-14 LAB — MEASLES/MUMPS/RUBELLA IMMUNITY
MUMPS IGG: 258 [AU]/ml
RUBELLA: 2.34 {index}

## 2018-06-14 LAB — CBC WITH DIFFERENTIAL/PLATELET
BASOS ABS: 0 10*3/uL (ref 0.0–0.1)
Basophils Relative: 0.9 % (ref 0.0–3.0)
Eosinophils Absolute: 0.1 10*3/uL (ref 0.0–0.7)
Eosinophils Relative: 1.9 % (ref 0.0–5.0)
HCT: 38.7 % (ref 36.0–46.0)
HEMOGLOBIN: 13.1 g/dL (ref 12.0–15.0)
Lymphocytes Relative: 32.9 % (ref 12.0–46.0)
Lymphs Abs: 1.7 10*3/uL (ref 0.7–4.0)
MCHC: 33.8 g/dL (ref 30.0–36.0)
MCV: 93.1 fl (ref 78.0–100.0)
MONO ABS: 0.4 10*3/uL (ref 0.1–1.0)
Monocytes Relative: 7.7 % (ref 3.0–12.0)
NEUTROS PCT: 56.6 % (ref 43.0–77.0)
Neutro Abs: 3 10*3/uL (ref 1.4–7.7)
Platelets: 262 10*3/uL (ref 150.0–400.0)
RBC: 4.16 Mil/uL (ref 3.87–5.11)
RDW: 13.1 % (ref 11.5–15.5)
WBC: 5.2 10*3/uL (ref 4.0–10.5)

## 2018-06-14 LAB — TSH: TSH: 2.15 u[IU]/mL (ref 0.35–4.50)

## 2018-06-14 LAB — T3, FREE: T3, Free: 2.7 pg/mL (ref 2.3–4.2)

## 2018-06-14 LAB — T4, FREE: FREE T4: 0.8 ng/dL (ref 0.60–1.60)

## 2018-06-15 NOTE — Telephone Encounter (Signed)
Mychart message has been sent to patient to ask patient to contact [facility] to fax [place] her pap results.

## 2018-08-10 ENCOUNTER — Encounter: Payer: Self-pay | Admitting: Internal Medicine

## 2018-08-10 ENCOUNTER — Other Ambulatory Visit: Payer: Self-pay | Admitting: Internal Medicine

## 2018-08-10 DIAGNOSIS — E669 Obesity, unspecified: Secondary | ICD-10-CM

## 2018-08-10 MED ORDER — PHENTERMINE HCL 37.5 MG PO TABS: 37.5000 mg | ORAL_TABLET | Freq: Every day | ORAL | 0 refills | Status: DC

## 2018-08-22 ENCOUNTER — Other Ambulatory Visit: Payer: Self-pay | Admitting: Internal Medicine

## 2018-08-22 DIAGNOSIS — G4700 Insomnia, unspecified: Secondary | ICD-10-CM

## 2018-08-22 MED ORDER — TEMAZEPAM 15 MG PO CAPS: 15.0000 mg | ORAL_CAPSULE | Freq: Every evening | ORAL | 2 refills | Status: DC | PRN

## 2018-09-02 ENCOUNTER — Ambulatory Visit: Payer: BLUE CROSS/BLUE SHIELD | Admitting: Internal Medicine

## 2018-09-02 ENCOUNTER — Encounter: Payer: Self-pay | Admitting: Internal Medicine

## 2018-09-02 VITALS — BP 110/68 | HR 76 | Temp 98.8°F | Ht 65.0 in | Wt 201.6 lb

## 2018-09-02 DIAGNOSIS — R946 Abnormal results of thyroid function studies: Secondary | ICD-10-CM

## 2018-09-02 DIAGNOSIS — G4700 Insomnia, unspecified: Secondary | ICD-10-CM | POA: Diagnosis not present

## 2018-09-02 DIAGNOSIS — E669 Obesity, unspecified: Secondary | ICD-10-CM | POA: Diagnosis not present

## 2018-09-02 DIAGNOSIS — Z23 Encounter for immunization: Secondary | ICD-10-CM | POA: Diagnosis not present

## 2018-09-02 DIAGNOSIS — F419 Anxiety disorder, unspecified: Secondary | ICD-10-CM

## 2018-09-02 DIAGNOSIS — E049 Nontoxic goiter, unspecified: Secondary | ICD-10-CM

## 2018-09-02 DIAGNOSIS — Z0000 Encounter for general adult medical examination without abnormal findings: Secondary | ICD-10-CM

## 2018-09-02 DIAGNOSIS — Z1389 Encounter for screening for other disorder: Secondary | ICD-10-CM

## 2018-09-02 MED ORDER — TEMAZEPAM 30 MG PO CAPS: 30.0000 mg | ORAL_CAPSULE | Freq: Every evening | ORAL | 2 refills | Status: DC | PRN

## 2018-09-02 MED ORDER — PHENTERMINE HCL 37.5 MG PO TABS: 37.5000 mg | ORAL_TABLET | Freq: Every day | ORAL | 0 refills | Status: DC

## 2018-09-02 NOTE — Patient Instructions (Addendum)
Want you double check 3d Mammogram  D3 1000-2000 daily  Call me back 01/2019 if want adipex again   Thyroid Ultrasound [facility]    Exercising to Lose Weight Exercising can help you to lose weight. In order to lose weight through exercise, you need to do vigorous-intensity exercise. You can tell that you are exercising with vigorous intensity if you are breathing very hard and fast and cannot hold a conversation while exercising. Moderate-intensity exercise helps to maintain your current weight. You can tell that you are exercising at a moderate level if you have a higher heart rate and faster breathing, but you are still able to hold a conversation. How often should I exercise? Choose an activity that you enjoy and set realistic goals. Your health care provider can help you to make an activity plan that works for you. Exercise regularly as directed by your health care provider. This may include:  Doing resistance training twice each week, such as: ? Push-ups. ? Sit-ups. ? Lifting weights. ? Using resistance bands.  Doing a given intensity of exercise for a given amount of time. Choose from these options: ? 150 minutes of moderate-intensity exercise every week. ? 75 minutes of vigorous-intensity exercise every week. ? A mix of moderate-intensity and vigorous-intensity exercise every week.  Children, pregnant women, people who are out of shape, people who are overweight, and older adults may need to consult a health care provider for individual recommendations. If you have any sort of medical condition, be sure to consult your health care provider before starting a new exercise program. What are some activities that can help me to lose weight?  Walking at a rate of at least 4.5 miles an hour.  Jogging or running at a rate of 5 miles per hour.  Biking at a rate of at least 10 miles per hour.  Lap swimming.  Roller-skating or in-line skating.  Cross-country skiing.  Vigorous  competitive sports, such as football, basketball, and soccer.  Jumping rope.  Aerobic dancing. How can I be more active in my day-to-day activities?  Use the stairs instead of the elevator.  Take a walk during your lunch break.  If you drive, park your car farther away from work or school.  If you take public transportation, get off one stop early and walk the rest of the way.  Make all of your phone calls while standing up and walking around.  Get up, stretch, and walk around every 30 minutes throughout the day. What guidelines should I follow while exercising?  Do not exercise so much that you hurt yourself, feel dizzy, or get very short of breath.  Consult your health care provider prior to starting a new exercise program.  Wear comfortable clothes and shoes with good support.  Drink plenty of water while you exercise to prevent dehydration or heat stroke. Body water is lost during exercise and must be replaced.  Work out until you breathe faster and your heart beats faster. This information is not intended to replace advice given to you by your health care provider. Make sure you discuss any questions you have with your health care provider. Document Released: 12/26/2010 Document Revised: 04/30/2016 Document Reviewed: 04/26/2014 Elsevier Interactive Patient Education  [employer].

## 2018-09-02 NOTE — Progress Notes (Signed)
Chief Complaint  Patient presents with  . Follow-up   F/u  1. Obesity with wt loss 34 lbs since 1st visit on adipex x 3 months will try 1 more month  2. Insomnia restoril 15 mg not working will increase dose  3. Anxiety controlled on celexa 40 mg qd      Review of Systems  Constitutional: Negative for weight loss.  HENT: Negative for hearing loss.   Eyes: Negative for blurred vision.  Respiratory: Negative for shortness of breath.   Cardiovascular: Negative for chest pain.  Gastrointestinal: Negative for abdominal pain.  Skin: Negative for rash.  Neurological: Negative for headaches.  Psychiatric/Behavioral: The patient has insomnia. The patient is not nervous/anxious.    Past Medical History:  Diagnosis Date  . Allergy   . Anxiety   . Anxiety   . Blood in stool   . Chicken pox   . Endometriosis   . Headache   . Leiomyoma of uterus   . Menorrhagia   . Seasonal allergies   . UTI (urinary tract infection)   . Vitamin D deficiency    Past Surgical History:  Procedure Laterality Date  . ABDOMINAL HYSTERECTOMY  2006   partial, still has ovaries and cervix [facility]   . COLONOSCOPY    . ENDOMETRIAL BIOPSY    . ESOPHAGOGASTRODUODENOSCOPY    . HYSTEROSCOPY    . LEG SURGERY  10/13/2017   s/p fall   . ORIF ANKLE FRACTURE Left 10/13/2017   Procedure: OPEN REDUCTION INTERNAL FIXATION (ORIF) ANKLE FRACTURE;  Surgeon: Lovell Sheehan, MD;  Location: [facility] ORS;  Service: Orthopedics;  Laterality: Left;  . TOTAL ABDOMINAL HYSTERECTOMY     Family History  Problem Relation Age of Onset  . Breast cancer Paternal Aunt 44  . Diabetes Paternal Aunt   . Colon cancer Maternal Grandmother   . Cancer Maternal Grandmother        ? type  . Lung cancer Maternal Grandfather   . Prostate cancer Maternal Grandfather   . Diabetes Paternal Grandmother   . Hypertension Mother   . Hypertension Brother    Social History   Socioeconomic History  . Marital status: Divorced    Spouse  name: Not on file  . Number of children: Not on file  . Years of education: Not on file  . Highest education level: Not on file  Occupational History  . Not on file  Social Needs  . Financial resource strain: Not on file  . Food insecurity:    Worry: Not on file    Inability: Not on file  . Transportation needs:    Medical: Not on file    Non-medical: Not on file  Tobacco Use  . Smoking status: Never Smoker  . Smokeless tobacco: Never Used  Substance and Sexual Activity  . Alcohol use: Yes    Comment: occasionally  . Drug use: No  . Sexual activity: Yes    Birth control/protection: Surgical  Lifestyle  . Physical activity:    Days per week: Not on file    Minutes per session: Not on file  . Stress: Not on file  Relationships  . Social connections:    Talks on phone: Not on file    Gets together: Not on file    Attends religious service: Not on file    Active member of club or organization: Not on file    Attends meetings of clubs or organizations: Not on file    Relationship status:  Not on file  . Intimate partner violence:    Fear of current or ex partner: Not on file    Emotionally abused: Not on file    Physically abused: Not on file    Forced sexual activity: Not on file  Other Topics Concern  . Not on file  Social History Narrative   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    Wears seat belts    No guns    1 son in [employer] lives in [place], 1 granddaughter age 58 y.o as of 06/2018 lives in [place] with her mother      Current Meds  Medication Sig  . Cholecalciferol (VITAMIN D3) 50000 units CAPS Take 50,000 Units every Thursday by mouth.   . citalopram (CELEXA) 40 MG tablet Take 1 tablet (40 mg total) by mouth daily.  Marland Kitchen ibuprofen (ADVIL,MOTRIN) 200 MG tablet Take 400 mg every 6 (six) hours as needed by mouth for headache.  . montelukast (SINGULAIR) 10 MG tablet Take 10 mg at bedtime by mouth.   . phentermine (ADIPEX-P) 37.5 MG tablet Take 1 tablet (37.5  mg total) by mouth daily before breakfast.  . Probiotic CAPS Take 1 capsule daily by mouth.  . temazepam (RESTORIL) 15 MG capsule Take 1 capsule (15 mg total) by mouth at bedtime as needed for sleep.   Allergies  Allergen Reactions  . Iodine Hives  . Fish Allergy     Hives even to smell of fish  . Pollen Extract    Recent Results (from the past 2160 hour(s))  TSH     Status: None   Collection Time: 06/13/18  4:28 PM  Result Value Ref Range   TSH 2.15 0.35 - 4.50 uIU/mL  T4, free     Status: None   Collection Time: 06/13/18  4:28 PM  Result Value Ref Range   Free T4 0.80 0.60 - 1.60 ng/dL    Comment: Specimens from patients who are undergoing biotin therapy and /or ingesting biotin supplements may contain high levels of biotin.  The higher biotin concentration in these specimens interferes with this Free T4 assay.  Specimens that contain high levels  of biotin may cause false high results for this Free T4 assay.  Please interpret results in light of the total clinical presentation of the patient.    T3, free     Status: None   Collection Time: 06/13/18  4:28 PM  Result Value Ref Range   T3, Free 2.7 2.3 - 4.2 pg/mL  CBC with Differential/Platelet     Status: None   Collection Time: 06/13/18  4:28 PM  Result Value Ref Range   WBC 5.2 4.0 - 10.5 K/uL   RBC 4.16 3.87 - 5.11 Mil/uL   Hemoglobin 13.1 12.0 - 15.0 g/dL   HCT 38.7 36.0 - 46.0 %   MCV 93.1 78.0 - 100.0 fl   MCHC 33.8 30.0 - 36.0 g/dL   RDW 13.1 11.5 - 15.5 %   Platelets 262.0 150.0 - 400.0 K/uL   Neutrophils Relative % 56.6 43.0 - 77.0 %   Lymphocytes Relative 32.9 12.0 - 46.0 %   Monocytes Relative 7.7 3.0 - 12.0 %   Eosinophils Relative 1.9 0.0 - 5.0 %   Basophils Relative 0.9 0.0 - 3.0 %   Neutro Abs 3.0 1.4 - 7.7 K/uL   Lymphs Abs 1.7 0.7 - 4.0 K/uL   Monocytes Absolute 0.4 0.1 - 1.0 K/uL   Eosinophils Absolute  0.1 0.0 - 0.7 K/uL   Basophils Absolute 0.0 0.0 - 0.1 K/uL  Measles/Mumps/Rubella Immunity      Status: None   Collection Time: 06/13/18  4:28 PM  Result Value Ref Range   Rubeola IgG >300.00 AU/mL    Comment: AU/mL            Interpretation -----            -------------- <25.00           Negative 25.00-29.99      Equivocal >29.99           Positive . A positive result indicates that the patient has antibody to measles virus. It does not differentiate  between an active or past infection. The clinical  diagnosis must be interpreted in conjunction with  clinical signs and symptoms of the patient.    Mumps IgG 258.00 AU/mL    Comment:  AU/mL           Interpretation -------         ---------------- <9.00             Negative 9.00-10.99        Equivocal >10.99            Positive A positive result indicates that the patient has  antibody to mumps virus. It does not differentiate between an  active or past infection. The clinical diagnosis must be interpreted in conjunction with clinical signs and symptoms of the patient. .    Rubella 2.34 index    Comment:     Index            Interpretation     -----            --------------       <0.90            Not consistent with Immunity     0.90-0.99        Equivocal     > or = 1.00      Consistent with Immunity  . The presence of rubella IgG antibody suggests  immunization or past or current infection with rubella virus.    Objective  Body mass index is 33.55 kg/m. Wt Readings from Last 3 Encounters:  09/02/18 201 lb 9.6 oz (91.4 kg)  06/13/18 223 lb (101.2 kg)  05/10/18 235 lb 12.8 oz (107 kg)   Temp Readings from Last 3 Encounters:  09/02/18 98.8 F (37.1 C) (Oral)  06/13/18 98.5 F (36.9 C) (Oral)  05/10/18 98.9 F (37.2 C) (Oral)   BP Readings from Last 3 Encounters:  09/02/18 110/68  06/13/18 126/78  05/10/18 122/80   Pulse Readings from Last 3 Encounters:  09/02/18 76  06/13/18 77  05/10/18 75    Physical Exam  Constitutional: She is oriented to person, place, and time. Vital signs are normal.  She appears well-developed and well-nourished. She is cooperative.  HENT:  Head: Normocephalic and atraumatic.  Mouth/Throat: Oropharynx is clear and moist and mucous membranes are normal.  Eyes: Pupils are equal, round, and reactive to light. Conjunctivae are normal.  Cardiovascular: Normal rate, regular rhythm and normal heart sounds.  Pulmonary/Chest: Effort normal and breath sounds normal.  Neurological: She is alert and oriented to person, place, and time. Gait normal.  Skin: Skin is warm, dry and intact.  Psychiatric: She has a normal mood and affect. Her speech is normal and behavior is normal. Judgment and thought content normal. Cognition and memory are normal.  Nursing note and vitals reviewed. ?thyroid goiter   Assessment   1. Obesity lost wt 34 lbs 2. Insomnia/anxiety  3. HM 4. ? Thyroid goiter with h/o abnormal thyroid labs 03/2018 Plan   1.  Cont adipex x 1 month month=4 months then take 3 month break  Consider resume 01/2019  2. Cont meds  Increase restoril to 30 mg qhs  3.  Flu shot given today  Tdap had 2015/2016 per pt  Declines hep B titer  MMR immune  Pap [facility] 12/2018  Referred mammogram will have at work 10/26/18 Colonoscopy age 81 y.o   4. Thyroid US monitor thyroid function mom h/o graves    Provider: Dr. Olivia Mackie McLean-Scocuzza-Internal Medicine

## 2018-09-02 NOTE — Progress Notes (Signed)
Pre visit review using our clinic review tool, if applicable. No additional management support is needed unless otherwise documented below in the visit note. 

## 2018-09-05 NOTE — Progress Notes (Signed)
No records of Tdap in [place]

## 2018-09-15 ENCOUNTER — Ambulatory Visit: Payer: BLUE CROSS/BLUE SHIELD

## 2018-10-14 ENCOUNTER — Encounter: Payer: Self-pay | Admitting: Internal Medicine

## 2018-10-14 ENCOUNTER — Other Ambulatory Visit: Payer: Self-pay | Admitting: Internal Medicine

## 2018-10-14 ENCOUNTER — Telehealth: Payer: Self-pay | Admitting: Internal Medicine

## 2018-10-14 DIAGNOSIS — E669 Obesity, unspecified: Secondary | ICD-10-CM

## 2018-10-14 NOTE — Telephone Encounter (Signed)
Call pt adipex we take for 4 months on and 3 months off  She can have this refilled after she has had a break for 3 months   [place]

## 2018-10-17 NOTE — Telephone Encounter (Signed)
Patient has been informed. She just wanted to make sure since the bottle said it was a partial prescription

## 2018-10-26 ENCOUNTER — Ambulatory Visit
Admission: RE | Admit: 2018-10-26 | Discharge: 2018-10-26 | Disposition: A | Discharge status: Home / Self Care | Payer: BLUE CROSS/BLUE SHIELD | Source: Ambulatory Visit | Attending: Internal Medicine | Admitting: Internal Medicine

## 2018-10-26 DIAGNOSIS — Z1231 Encounter for screening mammogram for malignant neoplasm of breast: Secondary | ICD-10-CM | POA: Diagnosis not present

## 2018-11-02 ENCOUNTER — Encounter: Payer: Self-pay | Admitting: Obstetrics and Gynecology

## 2018-11-04 ENCOUNTER — Encounter: Payer: Self-pay | Admitting: Obstetrics and Gynecology

## 2018-11-04 DIAGNOSIS — B9689 Other specified bacterial agents as the cause of diseases classified elsewhere: Secondary | ICD-10-CM | POA: Diagnosis not present

## 2018-11-04 DIAGNOSIS — N760 Acute vaginitis: Secondary | ICD-10-CM | POA: Diagnosis not present

## 2018-11-04 DIAGNOSIS — N898 Other specified noninflammatory disorders of vagina: Secondary | ICD-10-CM | POA: Diagnosis not present

## 2018-11-09 ENCOUNTER — Other Ambulatory Visit: Payer: Self-pay | Admitting: Internal Medicine

## 2018-11-09 DIAGNOSIS — F419 Anxiety disorder, unspecified: Secondary | ICD-10-CM

## 2018-11-09 MED ORDER — CITALOPRAM HYDROBROMIDE 40 MG PO TABS: 40.0000 mg | ORAL_TABLET | Freq: Every day | ORAL | 3 refills | Status: DC

## 2018-11-24 ENCOUNTER — Encounter: Payer: Self-pay | Admitting: Obstetrics and Gynecology

## 2018-11-24 ENCOUNTER — Other Ambulatory Visit: Payer: Self-pay | Admitting: Internal Medicine

## 2018-11-24 DIAGNOSIS — G4700 Insomnia, unspecified: Secondary | ICD-10-CM

## 2018-11-24 MED ORDER — TEMAZEPAM 30 MG PO CAPS: 30.0000 mg | ORAL_CAPSULE | Freq: Every evening | ORAL | 2 refills | Status: DC | PRN

## 2018-12-07 DIAGNOSIS — R87810 Cervical high risk human papillomavirus (HPV) DNA test positive: Secondary | ICD-10-CM

## 2018-12-07 DIAGNOSIS — R87610 Atypical squamous cells of undetermined significance on cytologic smear of cervix (ASC-US): Secondary | ICD-10-CM

## 2018-12-07 HISTORY — DX: Cervical high risk human papillomavirus (HPV) DNA test positive: R87.810

## 2018-12-07 HISTORY — DX: Atypical squamous cells of undetermined significance on cytologic smear of cervix (ASC-US): R87.610

## 2018-12-12 ENCOUNTER — Encounter: Payer: Self-pay | Admitting: Obstetrics and Gynecology

## 2018-12-14 ENCOUNTER — Ambulatory Visit (INDEPENDENT_AMBULATORY_CARE_PROVIDER_SITE_OTHER): Payer: BLUE CROSS/BLUE SHIELD | Admitting: Obstetrics and Gynecology

## 2018-12-14 ENCOUNTER — Encounter: Payer: Self-pay | Admitting: Obstetrics and Gynecology

## 2018-12-14 ENCOUNTER — Other Ambulatory Visit (HOSPITAL_COMMUNITY)
Admission: RE | Admit: 2018-12-14 | Discharge: 2018-12-14 | Disposition: A | Discharge status: Home / Self Care | Payer: BLUE CROSS/BLUE SHIELD | Source: Ambulatory Visit | Attending: Obstetrics and Gynecology | Admitting: Obstetrics and Gynecology

## 2018-12-14 VITALS — BP 120/78 | HR 75 | Ht 65.0 in | Wt 205.0 lb

## 2018-12-14 DIAGNOSIS — Z1151 Encounter for screening for human papillomavirus (HPV): Secondary | ICD-10-CM | POA: Insufficient documentation

## 2018-12-14 DIAGNOSIS — Z124 Encounter for screening for malignant neoplasm of cervix: Secondary | ICD-10-CM

## 2018-12-14 DIAGNOSIS — Z01419 Encounter for gynecological examination (general) (routine) without abnormal findings: Secondary | ICD-10-CM

## 2018-12-14 DIAGNOSIS — Z1239 Encounter for other screening for malignant neoplasm of breast: Secondary | ICD-10-CM

## 2018-12-14 DIAGNOSIS — B373 Candidiasis of vulva and vagina: Secondary | ICD-10-CM

## 2018-12-14 DIAGNOSIS — B3731 Acute candidiasis of vulva and vagina: Secondary | ICD-10-CM

## 2018-12-14 DIAGNOSIS — Z803 Family history of malignant neoplasm of breast: Secondary | ICD-10-CM

## 2018-12-14 LAB — POCT WET PREP WITH KOH
Clue Cells Wet Prep HPF POC: NEGATIVE
KOH Prep POC: NEGATIVE
Trichomonas, UA: NEGATIVE
Yeast Wet Prep HPF POC: POSITIVE

## 2018-12-14 MED ORDER — TERCONAZOLE 0.4 % VA CREA: 1.0000 | TOPICAL_CREAM | Freq: Every day | VAGINAL | 0 refills | Status: DC

## 2018-12-14 NOTE — Progress Notes (Addendum)
PCP:  McLean-Scocuzza, Nino Glow, MD   Chief Complaint  Patient presents with  . Gynecologic Exam    pt is having vaginal itching, discomfort, irritation since x 3 days     HPI:      [name] is a 48 y.o. [id number] who LMP was No LMP recorded. Patient has had a hysterectomy., presents today for her annual examination.  Her menses are absent due to hyst. She does not have intermenstrual bleeding.   Sex activity: single partner, contraception - status post hysterectomy.  Last Pap: 08/22/15  Results were: no abnormalities /neg HPV DNA  Hx of STDs: none  Pt complains of vaginal irritation/itch for several days with increased d/c, no odor today. Hx of BV in past. Recent abx use. No meds to treat.   Last mammogram: 10/26/18 Results were: normal--routine follow-up in 12 months There is a FH of breast cancer in her pat aunt. Pt is BRCA neg 2014, IBIS=16.8%. Pt has declined update testing in past. There is no FH of ovarian cancer. The patient does do self-breast exams.  Tobacco use: The patient denies current or previous tobacco use. Alcohol use: social No drug use.  Exercise: moderately active  She does get adequate calcium and Vitamin D in her diet.  Labs with PCP.    Past Medical History:  Diagnosis Date  . Allergy   . Anxiety   . Anxiety   . ASCUS with positive high risk HPV cervical 12/2018  . Blood in stool   . BV (bacterial vaginosis)   . Chicken pox   . Endometriosis   . Headache   . Leiomyoma of uterus   . Menorrhagia   . Seasonal allergies   . UTI (urinary tract infection)   . Vitamin D deficiency     Past Surgical History:  Procedure Laterality Date  . COLONOSCOPY    . ENDOMETRIAL BIOPSY    . ESOPHAGOGASTRODUODENOSCOPY    . HYSTEROSCOPY  2004  . LEG SURGERY  10/13/2017   s/p fall   . ORIF ANKLE FRACTURE Left 10/13/2017   Procedure: OPEN REDUCTION INTERNAL FIXATION (ORIF) ANKLE FRACTURE;  Surgeon: Lovell Sheehan, MD;  Location: [facility] ORS;  Service:  Orthopedics;  Laterality: Left;  . TOTAL ABDOMINAL HYSTERECTOMY  01/2005   leio, endometriosis; Dr. Ammie Dalton; has ovaries    Family History  Problem Relation Age of Onset  . Breast cancer Paternal Aunt 61  . Diabetes Paternal Aunt   . Colon cancer Maternal Grandmother   . Cancer Maternal Grandmother        ? type  . Lung cancer Maternal Grandfather   . Prostate cancer Maternal Grandfather   . Diabetes Paternal Grandmother   . Hypertension Mother   . Graves' disease Mother   . Hypertension Brother     Social History   Socioeconomic History  . Marital status: Divorced    Spouse name: Not on file  . Number of children: Not on file  . Years of education: Not on file  . Highest education level: Not on file  Occupational History  . Not on file  Social Needs  . Financial resource strain: Not on file  . Food insecurity:    Worry: Not on file    Inability: Not on file  . Transportation needs:    Medical: Not on file    Non-medical: Not on file  Tobacco Use  . Smoking status: Never Smoker  . Smokeless tobacco: Never Used  Substance and  Sexual Activity  . Alcohol use: Yes    Comment: occasionally  . Drug use: No  . Sexual activity: Yes    Birth control/protection: Surgical    Comment: Hysterectomy  Lifestyle  . Physical activity:    Days per week: Not on file    Minutes per session: Not on file  . Stress: Not on file  Relationships  . Social connections:    Talks on phone: Not on file    Gets together: Not on file    Attends religious service: Not on file    Active member of club or organization: Not on file    Attends meetings of clubs or organizations: Not on file    Relationship status: Not on file  . Intimate partner violence:    Fear of current or ex partner: Not on file    Emotionally abused: Not on file    Physically abused: Not on file    Forced sexual activity: Not on file  Other Topics Concern  . 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 Cholecalciferol (VITAMIN D3) 50000 units CAPS Take 50,000 Units every Thursday by mouth.   . citalopram (CELEXA) 40 MG tablet Take 1 tablet (40 mg total) by mouth daily.  Marland Kitchen ibuprofen (ADVIL,MOTRIN) 200 MG tablet Take 400 mg every 6 (six) hours as needed by mouth for headache.  . montelukast (SINGULAIR) 10 MG tablet Take 10 mg at bedtime by mouth.   . Probiotic CAPS Take 1 capsule daily by mouth.  . temazepam (RESTORIL) 30 MG capsule Take 1 capsule (30 mg total) by mouth at bedtime as needed for sleep.     ROS:  Review of Systems  Constitutional: Negative for fatigue, fever and unexpected weight change.  Respiratory: Negative for cough, shortness of breath and wheezing.   Cardiovascular: Negative for chest pain, palpitations and leg swelling.  Gastrointestinal: Negative for blood in stool, constipation, diarrhea, nausea and vomiting.  Endocrine: Negative for cold intolerance, heat intolerance and polyuria.  Genitourinary: Positive for vaginal discharge. Negative for dyspareunia, dysuria, flank pain, frequency, genital sores, hematuria, menstrual problem, pelvic pain, urgency, vaginal bleeding and vaginal pain.  Musculoskeletal: Negative for back pain, joint swelling and myalgias.  Skin: Negative for rash.  Neurological: Negative for dizziness, syncope, light-headedness, numbness and headaches.  Hematological: Negative for adenopathy.  Psychiatric/Behavioral: Negative for agitation, confusion, sleep disturbance and suicidal ideas. The patient is not nervous/anxious.      Objective: BP 120/78   Pulse 75   Ht '5\' 5"'  (1.651 m)   Wt 205 lb (93 kg)   BMI 34.11 kg/m    Physical Exam Constitutional:      Appearance: She is well-developed.  Genitourinary:     Vulva, right  adnexa and left adnexa normal.     Vulval tenderness present.     No vulval lesion noted.     Vaginal discharge and erythema present.     No vaginal tenderness or bleeding.     Cervix is absent.     No cervical polyp.     Uterus is absent.     No right or left adnexal mass  present.     Right adnexa not tender.     Left adnexa not tender.     Genitourinary Comments: UTERUS/CX SURG REM  Neck:     Musculoskeletal: Normal range of motion.     Thyroid: No thyromegaly.  Cardiovascular:     Rate and Rhythm: Normal rate and regular rhythm.     Heart sounds: Normal heart sounds. No murmur.  Pulmonary:     Effort: Pulmonary effort is normal.     Breath sounds: Normal breath sounds.  Chest:     Breasts:        Right: No mass, nipple discharge, skin change or tenderness.        Left: No mass, nipple discharge, skin change or tenderness.  Abdominal:     Palpations: Abdomen is soft.     Tenderness: There is no abdominal tenderness. There is no guarding.  Musculoskeletal: Normal range of motion.  Neurological:     Mental Status: She is alert and oriented to person, place, and time.     Cranial Nerves: No cranial nerve deficit.  Psychiatric:        Behavior: Behavior normal.  Vitals signs and nursing note reviewed.   RESULTS:   No results found for this or any previous visit (from the past 24 hour(s)).   Assessment/Plan: Encounter for annual routine gynecological examination  Cervical cancer screening - Plan: Cytology - PAP  Screening for HPV (human papillomavirus) - Plan: Cytology - PAP  Screening for breast cancer - Pt current on mammo.  - Plan: MM 3D SCREEN BREAST BILATERAL  Family history of breast cancer - MyRisk update testing discussed and pt declines. F/u prn.   Candidal vaginitis - Pos sx/wet prep. Rx terazol. F/u prn. Hx of BV so will RF meds if those sx occur due to abx use. Cont probiotics.  - Plan: POCT Wet Prep with KOH, terconazole (TERAZOL 7) 0.4 % vaginal  cream  Meds ordered this encounter  Medications  . terconazole (TERAZOL 7) 0.4 % vaginal cream    Sig: Place 1 applicator vaginally at bedtime.    Dispense:  45 g    Refill:  0    Order Specific Question:   Supervising Provider    Answer:   Gae Dry [[id number]]    GYN counsel breast self exam, mammography screening, adequate intake of calcium and vitamin D, diet and exercise     F/U  Return in about 1 year (around 12/15/2019).  Donnis Phaneuf B. Joby Hershkowitz, PA-C 01/04/2019 9:25 AM

## 2018-12-14 NOTE — Patient Instructions (Signed)
I value your feedback and entrusting us with your care. If you get a [facility] [place] patient survey, I would appreciate you taking the time to let us know about your experience today. Thank you! 

## 2018-12-19 LAB — CYTOLOGY - PAP
DIAGNOSIS: UNDETERMINED — AB
HPV: DETECTED — AB

## 2018-12-20 ENCOUNTER — Encounter: Payer: Self-pay | Admitting: Obstetrics and Gynecology

## 2019-01-03 ENCOUNTER — Ambulatory Visit (INDEPENDENT_AMBULATORY_CARE_PROVIDER_SITE_OTHER): Payer: BLUE CROSS/BLUE SHIELD | Admitting: Obstetrics and Gynecology

## 2019-01-03 ENCOUNTER — Other Ambulatory Visit (HOSPITAL_COMMUNITY)
Admission: RE | Admit: 2019-01-03 | Discharge: 2019-01-03 | Disposition: A | Discharge status: Home / Self Care | Payer: BLUE CROSS/BLUE SHIELD | Source: Ambulatory Visit | Attending: Obstetrics and Gynecology | Admitting: Obstetrics and Gynecology

## 2019-01-03 ENCOUNTER — Encounter: Payer: Self-pay | Admitting: Obstetrics and Gynecology

## 2019-01-03 VITALS — BP 118/74 | Ht 65.0 in | Wt 205.0 lb

## 2019-01-03 DIAGNOSIS — R87811 Vaginal high risk human papillomavirus (HPV) DNA test positive: Secondary | ICD-10-CM | POA: Diagnosis not present

## 2019-01-03 DIAGNOSIS — R87620 Atypical squamous cells of undetermined significance on cytologic smear of vagina (ASC-US): Secondary | ICD-10-CM

## 2019-01-03 DIAGNOSIS — R87610 Atypical squamous cells of undetermined significance on cytologic smear of cervix (ASC-US): Secondary | ICD-10-CM

## 2019-01-03 DIAGNOSIS — R87810 Cervical high risk human papillomavirus (HPV) DNA test positive: Secondary | ICD-10-CM | POA: Insufficient documentation

## 2019-01-03 NOTE — Progress Notes (Signed)
HPI:  Erica Mccall is a 48 y.o.  [id number]  who presents today for evaluation and management of abnormal cervical cytology.    Dysplasia History:  ASCUS, HPV+, patient states and notes in system are conflicting as to whether her cervix was removed during her hysterectomy.  Review of paper records shows that she no longer has a cervix.    OB History  Gravida Para Term Preterm AB Living  2 2 2     2   SAB TAB Ectopic Multiple Live Births          2    # Outcome Date GA Lbr Len/2nd Weight Sex Delivery Anes PTL Lv  2 Term 06/02/02    M Vag-Spont   LIV  1 Term 06/19/96    [facility]    Past Medical History:  Diagnosis Date  . Allergy   . Anxiety   . Anxiety   . ASCUS with positive high risk HPV cervical 12/2018  . Blood in stool   . BV (bacterial vaginosis)   . Chicken pox   . Endometriosis   . Headache   . Leiomyoma of uterus   . Menorrhagia   . Seasonal allergies   . UTI (urinary tract infection)   . Vitamin D deficiency     Past Surgical History:  Procedure Laterality Date  . ABDOMINAL HYSTERECTOMY  2006   partial, still has ovaries and cervix [facility]   . COLONOSCOPY    . ENDOMETRIAL BIOPSY    . ESOPHAGOGASTRODUODENOSCOPY    . HYSTEROSCOPY    . LEG SURGERY  10/13/2017   s/p fall   . ORIF ANKLE FRACTURE Left 10/13/2017   Procedure: OPEN REDUCTION INTERNAL FIXATION (ORIF) ANKLE FRACTURE;  Surgeon: Lovell Sheehan, MD;  Location: [facility] ORS;  Service: Orthopedics;  Laterality: Left;  . TOTAL ABDOMINAL HYSTERECTOMY      SOCIAL HISTORY: Social History   Substance and Sexual Activity  Alcohol Use Yes   Comment: occasionally   Social History   Substance and Sexual Activity  Drug Use No     Family History  Problem Relation Age of Onset  . Breast cancer Paternal Aunt 6  . Diabetes Paternal Aunt   . Colon cancer Maternal Grandmother   . Cancer Maternal Grandmother        ? type  . Lung cancer Maternal Grandfather   . Prostate cancer  Maternal Grandfather   . Diabetes Paternal Grandmother   . Hypertension Mother   . Graves' disease Mother   . Hypertension Brother     ALLERGIES:  Iodine; Fish allergy; and Pollen extract  Current Outpatient Medications on File Prior to Visit  Medication Sig Dispense Refill  . Cholecalciferol (VITAMIN D3) 50000 units CAPS Take 50,000 Units every Thursday by mouth.   2  . citalopram (CELEXA) 40 MG tablet Take 1 tablet (40 mg total) by mouth daily. 90 tablet 3  . ibuprofen (ADVIL,MOTRIN) 200 MG tablet Take 400 mg every 6 (six) hours as needed by mouth for headache.    . montelukast (SINGULAIR) 10 MG tablet Take 10 mg at bedtime by mouth.   6  . Probiotic CAPS Take 1 capsule daily by mouth.    . temazepam (RESTORIL) 30 MG capsule Take 1 capsule (30 mg total) by mouth at bedtime as needed for sleep. 30 capsule 2  . terconazole (TERAZOL 7) 0.4 % vaginal cream Place 1 applicator vaginally at bedtime. 45 g 0  No current facility-administered medications on file prior to visit.     Physical Exam: -Vitals:  BP 118/74   Ht 5\' 5"  (1.651 m)   Wt 205 lb (93 kg)   BMI 34.11 kg/m  GEN: WD, WN, NAD.  A+ O x 3, good mood and affect. ABD:  NT, ND.  Soft, no masses.  No hernias noted.   Pelvic:   Vulva: Normal appearance.  No lesions.  Vagina: No lesions or abnormalities noted.  Support: Normal pelvic support.  Urethra No masses tenderness or scarring.  Meatus Normal size without lesions or prolapse.  Cervix: See below.  Anus: Normal exam.  No lesions.  Perineum: Normal exam.  No lesions.        Bimanual   Uterus: Normal size.  Non-tender.  Mobile.  AV.  Adnexae: No masses.  Non-tender to palpation.  Cul-de-sac: Negative for abnormality.   PROCEDURE: 1.  Urine Pregnancy Test:  not done (hysterectomy) 2.  Colposcopy performed with 4% acetic acid after verbal consent obtained                           -Aceto-white Lesions Location(s):  Mildly and diffusely along vaginal cuff               -Biopsy performed randomly in mid vaginal cuff              -ECC indicated and performed: No.     -Biopsy sites made hemostatic with pressure and Monsel's solution   -Satisfactory colposcopy: No.    -Evidence of Invasive cervical CA :  NO  ASSESSMENT:  Erica Mccall is a 48 y.o. [id number] here for No diagnosis found.Erica Mccall  PLAN: I discussed the grading system of pap smears and HPV high risk viral types.  We will discuss and base management after colpo results return.     Prentice Docker, MD  [facility] 01/03/2019  17:40 AM   CC: Ardeth Perfect, PA-C [facility]

## 2019-01-04 ENCOUNTER — Encounter: Payer: Self-pay | Admitting: Obstetrics and Gynecology

## 2019-01-05 ENCOUNTER — Encounter: Payer: Self-pay | Admitting: Obstetrics and Gynecology

## 2019-01-10 NOTE — Progress Notes (Signed)
FYI, normal vaginal biopsy from her abnormal pap smear. I would just follow this up with a vaginal pap smear in one year.  At least now we know she doesn't have a cervix, even though the pap smear result noted an endocervical component found, which doesn't make any sense.Marland KitchenMarland KitchenMarland Kitchen

## 2019-01-30 ENCOUNTER — Encounter: Payer: Self-pay | Admitting: Internal Medicine

## 2019-01-30 MED ORDER — MONTELUKAST SODIUM 10 MG PO TABS: 10.0000 mg | ORAL_TABLET | Freq: Every day | ORAL | 1 refills | Status: DC

## 2019-02-12 ENCOUNTER — Encounter: Payer: Self-pay | Admitting: Emergency Medicine

## 2019-02-12 ENCOUNTER — Emergency Department: Payer: BLUE CROSS/BLUE SHIELD

## 2019-02-12 ENCOUNTER — Other Ambulatory Visit: Payer: Self-pay

## 2019-02-12 DIAGNOSIS — S0012XA Contusion of left eyelid and periocular area, initial encounter: Secondary | ICD-10-CM | POA: Diagnosis not present

## 2019-02-12 DIAGNOSIS — Z79899 Other long term (current) drug therapy: Secondary | ICD-10-CM | POA: Diagnosis not present

## 2019-02-12 DIAGNOSIS — S40812A Abrasion of left upper arm, initial encounter: Secondary | ICD-10-CM | POA: Diagnosis not present

## 2019-02-12 DIAGNOSIS — S0512XA Contusion of eyeball and orbital tissues, left eye, initial encounter: Secondary | ICD-10-CM | POA: Diagnosis not present

## 2019-02-12 DIAGNOSIS — Y939 Activity, unspecified: Secondary | ICD-10-CM | POA: Diagnosis not present

## 2019-02-12 DIAGNOSIS — S0011XA Contusion of right eyelid and periocular area, initial encounter: Secondary | ICD-10-CM | POA: Diagnosis not present

## 2019-02-12 DIAGNOSIS — S199XXA Unspecified injury of neck, initial encounter: Secondary | ICD-10-CM | POA: Diagnosis not present

## 2019-02-12 DIAGNOSIS — Y929 Unspecified place or not applicable: Secondary | ICD-10-CM | POA: Diagnosis not present

## 2019-02-12 DIAGNOSIS — Y999 Unspecified external cause status: Secondary | ICD-10-CM | POA: Insufficient documentation

## 2019-02-12 DIAGNOSIS — R51 Headache: Secondary | ICD-10-CM | POA: Diagnosis present

## 2019-02-12 DIAGNOSIS — S0083XA Contusion of other part of head, initial encounter: Secondary | ICD-10-CM | POA: Diagnosis not present

## 2019-02-12 DIAGNOSIS — S060X9A Concussion with loss of consciousness of unspecified duration, initial encounter: Secondary | ICD-10-CM | POA: Diagnosis not present

## 2019-02-12 DIAGNOSIS — T07XXXA Unspecified multiple injuries, initial encounter: Secondary | ICD-10-CM | POA: Diagnosis not present

## 2019-02-12 DIAGNOSIS — S1091XA Abrasion of unspecified part of neck, initial encounter: Secondary | ICD-10-CM | POA: Diagnosis not present
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
## 2019-02-13 ENCOUNTER — Emergency Department
Admission: EM | Admit: 2019-02-13 | Discharge: 2019-02-13 | Disposition: A | Discharge status: Home / Self Care | Payer: BLUE CROSS/BLUE SHIELD | Attending: Emergency Medicine | Admitting: Emergency Medicine

## 2019-02-13 DIAGNOSIS — S060X9A Concussion with loss of consciousness of unspecified duration, initial encounter: Secondary | ICD-10-CM

## 2019-02-13 DIAGNOSIS — S0012XA Contusion of left eyelid and periocular area, initial encounter: Secondary | ICD-10-CM

## 2019-02-13 DIAGNOSIS — T07XXXA Unspecified multiple injuries, initial encounter: Secondary | ICD-10-CM

## 2019-02-13 MED ORDER — BUTALBITAL-APAP-CAFFEINE 50-325-40 MG PO TABS
1.0000 | ORAL_TABLET | Freq: Once | ORAL | Status: AC
  Administered 2019-02-13: 1 via ORAL
  Filled 2019-02-13: qty 1

## 2019-02-13 MED ORDER — BUTALBITAL-APAP-CAFFEINE 50-325-40 MG PO TABS: 1.0000 | ORAL_TABLET | Freq: Four times a day (QID) | ORAL | 0 refills | Status: DC | PRN
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
## 2019-02-22 ENCOUNTER — Other Ambulatory Visit: Payer: Self-pay | Admitting: Internal Medicine

## 2019-02-22 ENCOUNTER — Encounter: Payer: Self-pay | Admitting: Internal Medicine

## 2019-02-22 DIAGNOSIS — G4700 Insomnia, unspecified: Secondary | ICD-10-CM

## 2019-02-22 MED ORDER — TEMAZEPAM 30 MG PO CAPS: 30.0000 mg | ORAL_CAPSULE | Freq: Every evening | ORAL | 2 refills | Status: DC | PRN

## 2019-02-22 NOTE — Telephone Encounter (Signed)
Sent to PCP for approval   Last OV  09/02/2018   Last refilled 11/24/2018 disp 30 with 2 refills   Sent to PCP for approval   Send to [facility]

## 2019-04-13 ENCOUNTER — Encounter: Payer: Self-pay | Admitting: Internal Medicine

## 2019-04-13 ENCOUNTER — Other Ambulatory Visit: Payer: Self-pay | Admitting: Internal Medicine

## 2019-04-13 DIAGNOSIS — E669 Obesity, unspecified: Secondary | ICD-10-CM

## 2019-04-13 MED ORDER — PHENTERMINE HCL 37.5 MG PO TABS: 37.5000 mg | ORAL_TABLET | Freq: Every day | ORAL | 0 refills | Status: DC

## 2019-05-16 ENCOUNTER — Other Ambulatory Visit: Payer: Self-pay | Admitting: Internal Medicine

## 2019-05-16 DIAGNOSIS — G4700 Insomnia, unspecified: Secondary | ICD-10-CM

## 2019-05-16 MED ORDER — TEMAZEPAM 30 MG PO CAPS: 30.0000 mg | ORAL_CAPSULE | Freq: Every evening | ORAL | 2 refills | Status: DC | PRN

## 2019-06-15 ENCOUNTER — Encounter: Payer: Self-pay | Admitting: Internal Medicine

## 2019-06-17 ENCOUNTER — Encounter: Payer: Self-pay | Admitting: Obstetrics and Gynecology

## 2019-08-22 ENCOUNTER — Other Ambulatory Visit: Payer: Self-pay | Admitting: Internal Medicine

## 2019-08-22 ENCOUNTER — Other Ambulatory Visit: Payer: Self-pay | Admitting: *Deleted

## 2019-08-22 DIAGNOSIS — G4700 Insomnia, unspecified: Secondary | ICD-10-CM

## 2019-08-22 MED ORDER — TEMAZEPAM 30 MG PO CAPS: 30.0000 mg | ORAL_CAPSULE | Freq: Every evening | ORAL | 2 refills | Status: DC | PRN

## 2019-11-15 DIAGNOSIS — J029 Acute pharyngitis, unspecified: Secondary | ICD-10-CM | POA: Diagnosis not present

## 2019-11-15 DIAGNOSIS — Z03818 Encounter for observation for suspected exposure to other biological agents ruled out: Secondary | ICD-10-CM | POA: Diagnosis not present

## 2019-11-21 ENCOUNTER — Other Ambulatory Visit: Payer: Self-pay | Admitting: Internal Medicine

## 2019-11-21 ENCOUNTER — Telehealth: Payer: Self-pay | Admitting: Internal Medicine

## 2019-11-21 DIAGNOSIS — Z20828 Contact with and (suspected) exposure to other viral communicable diseases: Secondary | ICD-10-CM | POA: Diagnosis not present

## 2019-11-21 DIAGNOSIS — F419 Anxiety disorder, unspecified: Secondary | ICD-10-CM

## 2019-11-21 DIAGNOSIS — Z20822 Contact with and (suspected) exposure to covid-19: Secondary | ICD-10-CM

## 2019-11-21 MED ORDER — CITALOPRAM HYDROBROMIDE 40 MG PO TABS: 40.0000 mg | ORAL_TABLET | Freq: Every day | ORAL | 3 refills | Status: DC
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
## 2019-11-21 NOTE — Telephone Encounter (Signed)
Pt needs appt w/in [place] (me or whos available) or urgent care  Sch covid 19 test and quarantine order in  She can text covid to [id number] to sch time for covid testing  Quarantine until neg results if + 14 days    [place]

## 2019-11-22 NOTE — Telephone Encounter (Signed)
Patient has had covid test  completed and was negative. Also she feel back to normal. No issues at all.

## 2019-11-30 ENCOUNTER — Other Ambulatory Visit: Payer: Self-pay | Admitting: Internal Medicine

## 2019-11-30 DIAGNOSIS — G4700 Insomnia, unspecified: Secondary | ICD-10-CM

## 2019-11-30 MED ORDER — TEMAZEPAM 30 MG PO CAPS: 30.0000 mg | ORAL_CAPSULE | Freq: Every evening | ORAL | 2 refills | Status: DC | PRN

## 2019-12-29 ENCOUNTER — Other Ambulatory Visit: Payer: Self-pay | Admitting: Internal Medicine

## 2019-12-29 ENCOUNTER — Encounter: Payer: Self-pay | Admitting: Internal Medicine

## 2019-12-29 DIAGNOSIS — Z1231 Encounter for screening mammogram for malignant neoplasm of breast: Secondary | ICD-10-CM

## 2020-01-01 NOTE — Addendum Note (Signed)
Addended by: Orland Mustard on: 01/01/2020 02:33 PM   Modules accepted: Orders

## 2020-01-08 ENCOUNTER — Encounter: Payer: Self-pay | Admitting: *Deleted

## 2020-01-08 ENCOUNTER — Ambulatory Visit: Payer: BLUE CROSS/BLUE SHIELD | Admitting: Obstetrics and Gynecology

## 2020-01-08 ENCOUNTER — Ambulatory Visit
Admission: RE | Admit: 2020-01-08 | Discharge: 2020-01-08 | Disposition: A | Discharge status: Home / Self Care | Payer: BC Managed Care – PPO | Source: Ambulatory Visit | Attending: Internal Medicine | Admitting: Internal Medicine

## 2020-01-08 DIAGNOSIS — Z0000 Encounter for general adult medical examination without abnormal findings: Secondary | ICD-10-CM | POA: Diagnosis not present

## 2020-01-08 DIAGNOSIS — Z1231 Encounter for screening mammogram for malignant neoplasm of breast: Secondary | ICD-10-CM | POA: Diagnosis not present

## 2020-01-08 DIAGNOSIS — R946 Abnormal results of thyroid function studies: Secondary | ICD-10-CM | POA: Diagnosis not present

## 2020-01-08 DIAGNOSIS — E049 Nontoxic goiter, unspecified: Secondary | ICD-10-CM | POA: Diagnosis not present

## 2020-01-08 DIAGNOSIS — Z1389 Encounter for screening for other disorder: Secondary | ICD-10-CM | POA: Diagnosis not present

## 2020-01-08 NOTE — Progress Notes (Unsigned)
Sent by Mychart.  ° °

## 2020-01-09 ENCOUNTER — Other Ambulatory Visit: Payer: Self-pay

## 2020-01-09 LAB — COMPREHENSIVE METABOLIC PANEL
ALT: 15 IU/L (ref 0–32)
AST: 17 IU/L (ref 0–40)
Albumin/Globulin Ratio: 1.3 (ref 1.2–2.2)
Albumin: 3.9 g/dL (ref 3.8–4.8)
Alkaline Phosphatase: 76 IU/L (ref 39–117)
BUN/Creatinine Ratio: 11 (ref 9–23)
BUN: 10 mg/dL (ref 6–24)
Bilirubin Total: 0.2 mg/dL (ref 0.0–1.2)
CO2: 22 mmol/L (ref 20–29)
Calcium: 9 mg/dL (ref 8.7–10.2)
Chloride: 103 mmol/L (ref 96–106)
Creatinine, Ser: 0.88 mg/dL (ref 0.57–1.00)
GFR calc Af Amer: 90 mL/min/{1.73_m2} (ref >59)
GFR calc non Af Amer: 78 mL/min/{1.73_m2} (ref >59)
Globulin, Total: 2.9 g/dL (ref 1.5–4.5)
Glucose: 76 mg/dL (ref 65–99)
Potassium: 4.2 mmol/L (ref 3.5–5.2)
Sodium: 139 mmol/L (ref 134–144)
Total Protein: 6.8 g/dL (ref 6.0–8.5)

## 2020-01-09 LAB — URINALYSIS, ROUTINE W REFLEX MICROSCOPIC
Bilirubin, UA: NEGATIVE
Glucose, UA: NEGATIVE
Ketones, UA: NEGATIVE
Leukocytes,UA: NEGATIVE
Nitrite, UA: NEGATIVE
Protein,UA: NEGATIVE
RBC, UA: NEGATIVE
Specific Gravity, UA: 1.016 (ref 1.005–1.030)
Urobilinogen, Ur: 0.2 mg/dL (ref 0.2–1.0)
pH, UA: 5.5 (ref 5.0–7.5)

## 2020-01-09 LAB — T4, FREE: Free T4: 0.67 ng/dL — ABNORMAL LOW (ref 0.82–1.77)

## 2020-01-09 LAB — CBC WITH DIFFERENTIAL/PLATELET
Basophils Absolute: 0 10*3/uL (ref 0.0–0.2)
Basos: 1 %
EOS (ABSOLUTE): 0.3 10*3/uL (ref 0.0–0.4)
Eos: 7 %
Hematocrit: 36.7 % (ref 34.0–46.6)
Hemoglobin: 12.7 g/dL (ref 11.1–15.9)
Immature Grans (Abs): 0 10*3/uL (ref 0.0–0.1)
Immature Granulocytes: 0 %
Lymphocytes Absolute: 1.8 10*3/uL (ref 0.7–3.1)
Lymphs: 45 %
MCH: 32.6 pg (ref 26.6–33.0)
MCHC: 34.6 g/dL (ref 31.5–35.7)
MCV: 94 fL (ref 79–97)
Monocytes Absolute: 0.4 10*3/uL (ref 0.1–0.9)
Monocytes: 9 %
Neutrophils Absolute: 1.5 10*3/uL (ref 1.4–7.0)
Neutrophils: 38 %
Platelets: 240 10*3/uL (ref 150–450)
RBC: 3.9 x10E6/uL (ref 3.77–5.28)
RDW: 11.8 % (ref 11.7–15.4)
WBC: 4 10*3/uL (ref 3.4–10.8)

## 2020-01-09 LAB — LIPID PANEL
Chol/HDL Ratio: 3 ratio (ref 0.0–4.4)
Cholesterol, Total: 173 mg/dL (ref 100–199)
HDL: 58 mg/dL (ref >39)
LDL Chol Calc (NIH): 95 mg/dL (ref 0–99)
Triglycerides: 109 mg/dL (ref 0–149)
VLDL Cholesterol Cal: 20 mg/dL (ref 5–40)

## 2020-01-09 LAB — TSH: TSH: 1.54 u[IU]/mL (ref 0.450–4.500)
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
## 2020-01-15 ENCOUNTER — Other Ambulatory Visit: Payer: Self-pay

## 2020-01-15 ENCOUNTER — Encounter: Payer: Self-pay | Admitting: Obstetrics and Gynecology

## 2020-01-15 ENCOUNTER — Ambulatory Visit (INDEPENDENT_AMBULATORY_CARE_PROVIDER_SITE_OTHER): Payer: BC Managed Care – PPO | Admitting: Obstetrics and Gynecology

## 2020-01-15 VITALS — BP 120/80 | Ht 65.0 in | Wt 224.0 lb

## 2020-01-15 DIAGNOSIS — Z01419 Encounter for gynecological examination (general) (routine) without abnormal findings: Secondary | ICD-10-CM | POA: Diagnosis not present

## 2020-01-15 DIAGNOSIS — Z124 Encounter for screening for malignant neoplasm of cervix: Secondary | ICD-10-CM | POA: Diagnosis not present

## 2020-01-15 DIAGNOSIS — R87610 Atypical squamous cells of undetermined significance on cytologic smear of cervix (ASC-US): Secondary | ICD-10-CM

## 2020-01-15 DIAGNOSIS — Z1151 Encounter for screening for human papillomavirus (HPV): Secondary | ICD-10-CM | POA: Diagnosis not present

## 2020-01-15 DIAGNOSIS — Z803 Family history of malignant neoplasm of breast: Secondary | ICD-10-CM | POA: Diagnosis not present

## 2020-01-15 DIAGNOSIS — Z90710 Acquired absence of both cervix and uterus: Secondary | ICD-10-CM | POA: Diagnosis not present

## 2020-01-15 DIAGNOSIS — Z1211 Encounter for screening for malignant neoplasm of colon: Secondary | ICD-10-CM

## 2020-01-15 DIAGNOSIS — Z1231 Encounter for screening mammogram for malignant neoplasm of breast: Secondary | ICD-10-CM

## 2020-01-15 NOTE — Patient Instructions (Signed)
I value your feedback and entrusting us with your care. If you get a [facility] patient survey, I would appreciate you taking the time to let us know about your experience today. Thank you!  As of November 16, 2019, your lab results will be released to your MyChart immediately, before I even have a chance to see them. Please give me time to review them and contact you if there are any abnormalities. Thank you for your patience.  

## 2020-01-16 ENCOUNTER — Ambulatory Visit (INDEPENDENT_AMBULATORY_CARE_PROVIDER_SITE_OTHER): Payer: BC Managed Care – PPO | Admitting: Internal Medicine

## 2020-01-16 ENCOUNTER — Encounter: Payer: Self-pay | Admitting: Internal Medicine

## 2020-01-16 VITALS — BP 114/70 | HR 69 | Temp 97.8°F | Ht 65.0 in | Wt 228.8 lb

## 2020-01-16 DIAGNOSIS — Z0000 Encounter for general adult medical examination without abnormal findings: Secondary | ICD-10-CM | POA: Diagnosis not present

## 2020-01-16 DIAGNOSIS — R946 Abnormal results of thyroid function studies: Secondary | ICD-10-CM

## 2020-01-16 DIAGNOSIS — G4700 Insomnia, unspecified: Secondary | ICD-10-CM | POA: Diagnosis not present

## 2020-01-16 MED ORDER — TEMAZEPAM 30 MG PO CAPS: 30.0000 mg | ORAL_CAPSULE | Freq: Every evening | ORAL | 2 refills | Status: DC | PRN

## 2020-01-16 NOTE — Progress Notes (Signed)
Chief Complaint  Patient presents with  . Annual Exam   Annual doing well  1. ph q 9 score 6 on celexa 40 mg qd and insomnia on restoril 30 mg qhs  Stressor include children 1 son driving and working and another in [place] and was in legal issues due to ex wife and she has to go to [place] soon  2. Abnormal thyroid function test FH mom graves and m aunt thyroid issues    Review of Systems  Constitutional: Negative for weight loss.  HENT: Negative for hearing loss.   Eyes: Negative for blurred vision.  Respiratory: Negative for shortness of breath.   Cardiovascular: Negative for chest pain.  Gastrointestinal: Negative for blood in stool.  Musculoskeletal: Negative for falls.  Skin: Negative for rash.  Psychiatric/Behavioral: The patient is nervous/anxious and has insomnia.    Past Medical History:  Diagnosis Date  . Allergy   . Anxiety   . Anxiety   . ASCUS with positive high risk HPV cervical 12/2018  . Blood in stool   . BV (bacterial vaginosis)   . Chicken pox   . Endometriosis   . Headache   . Leiomyoma of uterus   . Menorrhagia   . Seasonal allergies   . UTI (urinary tract infection)   . Vitamin D deficiency    Past Surgical History:  Procedure Laterality Date  . COLONOSCOPY    . ENDOMETRIAL BIOPSY    . ESOPHAGOGASTRODUODENOSCOPY    . HYSTEROSCOPY  2004  . LEG SURGERY  10/13/2017   s/p fall   . ORIF ANKLE FRACTURE Left 10/13/2017   Procedure: OPEN REDUCTION INTERNAL FIXATION (ORIF) ANKLE FRACTURE;  Surgeon: Lovell Sheehan, MD;  Location: [facility] ORS;  Service: Orthopedics;  Laterality: Left;  . TOTAL ABDOMINAL HYSTERECTOMY  01/2005   leio, endometriosis; Dr. Ammie Dalton; has ovaries   Family History  Problem Relation Age of Onset  . Breast cancer Paternal Aunt 67  . Diabetes Paternal Aunt   . Colon cancer Maternal Grandmother 29  . Lung cancer Maternal Grandfather   . Prostate cancer Maternal Grandfather   . Diabetes Paternal Grandmother   . Hypertension Mother    . Graves' disease Mother   . Hypertension Brother   . Thyroid disease Maternal Aunt    Social History   Socioeconomic History  . Marital status: Divorced    Spouse name: Not on file  . Number of children: Not on file  . Years of education: Not on file  . Highest education level: Not on file  Occupational History  . Not on file  Tobacco Use  . Smoking status: Never Smoker  . Smokeless tobacco: Never Used  Substance and Sexual Activity  . Alcohol use: Yes    Comment: occasionally  . Drug use: No  . Sexual activity: Yes    Birth control/protection: Surgical    Comment: Hysterectomy  Other Topics Concern  . Not on file  Social History Narrative   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    Wears seat belts    No guns    1 son in [employer] lives in [place], 1 granddaughter age 57 y.o as of 06/2018 lives in [place] with her mother      Social Determinants of Health   Financial Resource Strain:   . Difficulty of Paying Living Expenses: Not on file  Food Insecurity:   . Worried About Charity fundraiser in the Last Year: Not  on file  . Ran Out of Food in the Last Year: Not on file  Transportation Needs:   . Lack of Transportation (Medical): Not on file  . Lack of Transportation (Non-Medical): Not on file  Physical Activity:   . Days of Exercise per Week: Not on file  . Minutes of Exercise per Session: Not on file  Stress:   . Feeling of Stress : Not on file  Social Connections:   . Frequency of Communication with Friends and Family: Not on file  . Frequency of Social Gatherings with Friends and Family: Not on file  . Attends Religious Services: Not on file  . Active Member of Clubs or Organizations: Not on file  . Attends Archivist Meetings: Not on file  . Marital Status: Not on file  Intimate Partner Violence:   . Fear of Current or Ex-Partner: Not on file  . Emotionally Abused: Not on file  . Physically Abused: Not on file  . Sexually Abused: Not on  file   Current Meds  Medication Sig  . Cholecalciferol (VITAMIN D3) 50000 units CAPS Take 50,000 Units every Thursday by mouth.   . citalopram (CELEXA) 40 MG tablet Take 1 tablet (40 mg total) by mouth daily.  . fluticasone (FLONASE) 50 MCG/ACT nasal spray Place into the nose.  . ibuprofen (ADVIL,MOTRIN) 200 MG tablet Take 400 mg every 6 (six) hours as needed by mouth for headache.  . Probiotic CAPS Take 1 capsule daily by mouth.  . temazepam (RESTORIL) 30 MG capsule Take 1 capsule (30 mg total) by mouth at bedtime as needed for sleep.  . [DISCONTINUED] temazepam (RESTORIL) 30 MG capsule Take 1 capsule (30 mg total) by mouth at bedtime as needed for sleep.   Allergies  Allergen Reactions  . Iodine Hives  . Fish Allergy     Hives even to smell of fish  . Pollen Extract    Recent Results (from the past 2160 hour(s))  Comprehensive metabolic panel     Status: None   Collection Time: 01/08/20 10:17 AM  Result Value Ref Range   Glucose 76 65 - 99 mg/dL   BUN 10 6 - 24 mg/dL   Creatinine, Ser 0.88 0.57 - 1.00 mg/dL   GFR calc non Af Amer 78 >59 mL/min/1.73   GFR calc Af Amer 90 >59 mL/min/1.73   BUN/Creatinine Ratio 11 9 - 23   Sodium 139 134 - 144 mmol/L   Potassium 4.2 3.5 - 5.2 mmol/L   Chloride 103 96 - 106 mmol/L   CO2 22 20 - 29 mmol/L   Calcium 9.0 8.7 - 10.2 mg/dL   Total Protein 6.8 6.0 - 8.5 g/dL   Albumin 3.9 3.8 - 4.8 g/dL   Globulin, Total 2.9 1.5 - 4.5 g/dL   Albumin/Globulin Ratio 1.3 1.2 - 2.2   Bilirubin Total 0.2 0.0 - 1.2 mg/dL   Alkaline Phosphatase 76 39 - 117 IU/L   AST 17 0 - 40 IU/L   ALT 15 0 - 32 IU/L  Lipid panel     Status: None   Collection Time: 01/08/20 10:17 AM  Result Value Ref Range   Cholesterol, Total 173 100 - 199 mg/dL   Triglycerides 109 0 - 149 mg/dL   HDL 58 >39 mg/dL   VLDL Cholesterol Cal 20 5 - 40 mg/dL   LDL Chol Calc (NIH) 95 0 - 99 mg/dL   Chol/HDL Ratio 3.0 0.0 - 4.4 ratio    Comment:  T.  Chol/HDL Ratio                                             Men  Women                               1/2 Avg.Risk  3.4    3.3                                   Avg.Risk  5.0    4.4                                2X Avg.Risk  9.6    7.1                                3X Avg.Risk 23.4   11.0   CBC w/Diff     Status: None   Collection Time: 01/08/20 10:17 AM  Result Value Ref Range   WBC 4.0 3.4 - 10.8 x10E3/uL   RBC 3.90 3.77 - 5.28 x10E6/uL   Hemoglobin 12.7 11.1 - 15.9 g/dL   Hematocrit 36.7 34.0 - 46.6 %   MCV 94 79 - 97 fL   MCH 32.6 26.6 - 33.0 pg   MCHC 34.6 31.5 - 35.7 g/dL   RDW 11.8 11.7 - 15.4 %   Platelets 240 150 - 450 x10E3/uL   Neutrophils 38 Not Estab. %   Lymphs 45 Not Estab. %   Monocytes 9 Not Estab. %   Eos 7 Not Estab. %   Basos 1 Not Estab. %   Neutrophils Absolute 1.5 1.4 - 7.0 x10E3/uL   Lymphocytes Absolute 1.8 0.7 - 3.1 x10E3/uL   Monocytes Absolute 0.4 0.1 - 0.9 x10E3/uL   EOS (ABSOLUTE) 0.3 0.0 - 0.4 x10E3/uL   Basophils Absolute 0.0 0.0 - 0.2 x10E3/uL   Immature Granulocytes 0 Not Estab. %   Immature Grans (Abs) 0.0 0.0 - 0.1 x10E3/uL  TSH     Status: None   Collection Time: 01/08/20 10:17 AM  Result Value Ref Range   TSH 1.540 0.450 - 4.500 uIU/mL  Urinalysis, Routine w reflex microscopic     Status: Abnormal   Collection Time: 01/08/20 10:17 AM  Result Value Ref Range   Specific Gravity, UA 1.016 1.005 - 1.030   pH, UA 5.5 5.0 - 7.5   Color, UA Yellow Yellow   Appearance Ur Cloudy (A) Clear   Leukocytes,UA Negative Negative   Protein,UA Negative Negative/Trace   Glucose, UA Negative Negative   Ketones, UA Negative Negative   RBC, UA Negative Negative   Bilirubin, UA Negative Negative   Urobilinogen, Ur 0.2 0.2 - 1.0 mg/dL   Nitrite, UA Negative Negative   Microscopic Examination Comment     Comment: Microscopic not indicated and not performed.  T4, free     Status: Abnormal   Collection Time: 01/08/20 10:17 AM  Result Value Ref Range    Free T4 0.67 (L) 0.82 - 1.77 ng/dL   Objective  Body mass index is 38.07 kg/m. Wt Readings from Last 3 Encounters:  01/16/20 228 lb 12.8 oz (103.8  kg)  01/15/20 224 lb (101.6 kg)  02/12/19 196 lb (88.9 kg)   Temp Readings from Last 3 Encounters:  01/16/20 97.8 F (36.6 C) (Skin)  02/12/19 98.7 F (37.1 C) (Oral)  09/02/18 98.8 F (37.1 C) (Oral)   BP Readings from Last 3 Encounters:  01/16/20 114/70  01/15/20 120/80  02/13/19 (!) 144/101   Pulse Readings from Last 3 Encounters:  01/16/20 69  02/13/19 78  12/14/18 75    Physical Exam Vitals and nursing note reviewed.  Constitutional:      Appearance: Normal appearance. She is well-developed and well-groomed. She is obese.  HENT:     Head: Normocephalic and atraumatic.  Eyes:     Conjunctiva/sclera: Conjunctivae normal.     Pupils: Pupils are equal, round, and reactive to light.  Cardiovascular:     Rate and Rhythm: Normal rate and regular rhythm.     Heart sounds: Normal heart sounds. No murmur.  Pulmonary:     Effort: Pulmonary effort is normal.     Breath sounds: Normal breath sounds.  Abdominal:     General: Abdomen is flat. Bowel sounds are normal.     Palpations: Abdomen is soft.     Tenderness: There is no abdominal tenderness.  Skin:    General: Skin is warm and dry.  Neurological:     General: No focal deficit present.     Mental Status: She is alert and oriented to person, place, and time. Mental status is at baseline.     Gait: Gait normal.  Psychiatric:        Attention and Perception: Attention and perception normal.        Mood and Affect: Mood and affect normal.        Speech: Speech normal.        Behavior: Behavior normal. Behavior is cooperative.        Thought Content: Thought content normal.        Cognition and Memory: Cognition and memory normal.        Judgment: Judgment normal.     Assessment  Plan  Annual physical exam Flu shot utd  Tdap had 2015/2016 per pt  Declines hep B  titer MMR immune  Pap [facility] 12/2018 ascus f/u had 01/15/20 pending result  mammogram 01/08/20 negative  Colonoscopy age 37-49 screening consider post covid  rec exercise and healthy diet choices    Abnormal thyroid function test - Plan: US THYROID Copy of thyroid US ROI sent [facility] today  FH thyroid d/o  Insomnia, unspecified type - Plan: temazepam (RESTORIL) 30 MG capsule   Provider: Dr. Olivia Mackie McLean-[facility]oc[facility]

## 2020-01-16 NOTE — Patient Instructions (Addendum)
COVID-19 Vaccine Information can be found at: [email or web address] For questions related to vaccine distribution or appointments, please email [email or web address] or call [phone].    covid 19 action plan  Vitamin C 1000 mg daily  Zinc 100 mg daily  Quercetin 250-500 mg 2x per day  Vitamin D 4000 to 5000 IU daily  Tylenol as needed  Mucinex DM green label  [place]  Pulse oximeter to check Oxygen Increase with water for hydration     Exercising to Lose Weight Exercise is structured, repetitive physical activity to improve fitness and health. Getting regular exercise is important for everyone. It is especially important if you are overweight. Being overweight increases your risk of heart disease, stroke, diabetes, high blood pressure, and several types of cancer. Reducing your calorie intake and exercising can help you lose weight. Exercise is usually categorized as moderate or vigorous intensity. To lose weight, most people need to do a certain amount of moderate-intensity or vigorous-intensity exercise each week. Moderate-intensity exercise  Moderate-intensity exercise is any activity that gets you moving enough to burn at least three times more energy (calories) than if you were sitting. Examples of moderate exercise include:  Walking a mile in 15 minutes.  Doing light yard work.  Biking at an easy pace. Most people should get at least 150 minutes (2 hours and 30 minutes) a week of moderate-intensity exercise to maintain their body weight. Vigorous-intensity exercise Vigorous-intensity exercise is any activity that gets you moving enough to burn at least six times more calories than if you were sitting. When you exercise at this intensity, you should be working hard enough that you are not able to carry on a conversation. Examples of vigorous exercise include:  Running.  Playing a team sport, such as football,  basketball, and soccer.  Jumping rope. Most people should get at least 75 minutes (1 hour and 15 minutes) a week of vigorous-intensity exercise to maintain their body weight. How can exercise affect me? When you exercise enough to burn more calories than you eat, you lose weight. Exercise also reduces body fat and builds muscle. The more muscle you have, the more calories you burn. Exercise also:  Improves mood.  Reduces stress and tension.  Improves your overall fitness, flexibility, and endurance.  Increases bone strength. The amount of exercise you need to lose weight depends on:  Your age.  The type of exercise.  Any health conditions you have.  Your overall physical ability. Talk to your health care provider about how much exercise you need and what types of activities are safe for you. What actions can I take to lose weight? Nutrition   Make changes to your diet as told by your health care provider or diet and nutrition specialist (dietitian). This may include: ? Eating fewer calories. ? Eating more protein. ? Eating less unhealthy fats. ? Eating a diet that includes fresh fruits and vegetables, whole grains, low-fat dairy products, and lean protein. ? Avoiding foods with added fat, salt, and sugar.  Drink plenty of water while you exercise to prevent dehydration or heat stroke. Activity  Choose an activity that you enjoy and set realistic goals. Your health care provider can help you make an exercise plan that works for you.  Exercise at a moderate or vigorous intensity most days of the week. ? The intensity of exercise may vary from person to person. You can tell how intense a workout is for you by paying attention to your breathing and heartbeat. Most people will  notice their breathing and heartbeat get faster with more intense exercise.  Do resistance training twice each week, such as: ? Push-ups. ? Sit-ups. ? Lifting weights. ? Using resistance  bands.  Getting short amounts of exercise can be just as helpful as long structured periods of exercise. If you have trouble finding time to exercise, try to include exercise in your daily routine. ? Get up, stretch, and walk around every 30 minutes throughout the day. ? Go for a walk during your lunch break. ? Park your car farther away from your destination. ? If you take public transportation, get off one stop early and walk the rest of the way. ? Make phone calls while standing up and walking around. ? Take the stairs instead of elevators or escalators.  Wear comfortable clothes and shoes with good support.  Do not exercise so much that you hurt yourself, feel dizzy, or get very short of breath. Where to find more information  U.S. Department of Health and Human Services: BondedCompany.at  Centers for Disease Control and Prevention (CDC): http://www.wolf.info/ Contact a health care provider:  Before starting a new exercise program.  If you have questions or concerns about your weight.  If you have a medical problem that keeps you from exercising. Get help right away if you have any of the following while exercising:  Injury.  Dizziness.  Difficulty breathing or shortness of breath that does not go away when you stop exercising.  Chest pain.  Rapid heartbeat. Summary  Being overweight increases your risk of heart disease, stroke, diabetes, high blood pressure, and several types of cancer.  Losing weight happens when you burn more calories than you eat.  Reducing the amount of calories you eat in addition to getting regular moderate or vigorous exercise each week helps you lose weight. This information is not intended to replace advice given to you by your health care provider. Make sure you discuss any questions you have with your health care provider. Document Revised: 12/06/2017 Document Reviewed: 12/06/2017 Elsevier Patient Education  [place],  Adult After being diagnosed with an anxiety disorder, you may be relieved to know why you have felt or behaved a certain way. You may also feel overwhelmed about the treatment ahead and what it will mean for your life. With care and support, you can manage this condition and recover from it. How to manage lifestyle changes Managing stress and anxiety  Stress is your body's reaction to life changes and events, both good and bad. Most stress will last just a few hours, but stress can be ongoing and can lead to more than just stress. Although stress can play a major role in anxiety, it is not the same as anxiety. Stress is usually caused by something external, such as a deadline, test, or competition. Stress normally passes after the triggering event has ended.  Anxiety is caused by something internal, such as imagining a terrible outcome or worrying that something will go wrong that will devastate you. Anxiety often does not go away even after the triggering event is over, and it can become long-term (chronic) worry. It is important to understand the differences between stress and anxiety and to manage your stress effectively so that it does not lead to an anxious response. Talk with your health care provider or a counselor to learn more about reducing anxiety and stress. He or she may suggest tension reduction techniques, such as:  Music therapy. This can include creating or  listening to music that you enjoy and that inspires you.  Mindfulness-based meditation. This involves being aware of your normal breaths while not trying to control your breathing. It can be done while sitting or walking.  Centering prayer. This involves focusing on a word, phrase, or sacred image that means something to you and brings you peace.  Deep breathing. To do this, expand your stomach and inhale slowly through your nose. Hold your breath for 3-5 seconds. Then exhale slowly, letting your stomach muscles  relax.  Self-talk. This involves identifying thought patterns that lead to anxiety reactions and changing those patterns.  Muscle relaxation. This involves tensing muscles and then relaxing them. Choose a tension reduction technique that suits your lifestyle and personality. These techniques take time and practice. Set aside 5-15 minutes a day to do them. Therapists can offer counseling and training in these techniques. The training to help with anxiety may be covered by some insurance plans. Other things you can do to manage stress and anxiety include:  Keeping a stress/anxiety diary. This can help you learn what triggers your reaction and then learn ways to manage your response.  Thinking about how you react to certain situations. You may not be able to control everything, but you can control your response.  Making time for activities that help you relax and not feeling guilty about spending your time in this way.  Visual imagery and yoga can help you stay calm and relax.  Medicines Medicines can help ease symptoms. Medicines for anxiety include:  Anti-anxiety drugs.  Antidepressants. Medicines are often used as a primary treatment for anxiety disorder. Medicines will be prescribed by a health care provider. When used together, medicines, psychotherapy, and tension reduction techniques may be the most effective treatment. Relationships Relationships can play a big part in helping you recover. Try to spend more time connecting with trusted friends and family members. Consider going to couples counseling, taking family education classes, or going to family therapy. Therapy can help you and others better understand your condition. How to recognize changes in your anxiety Everyone responds differently to treatment for anxiety. Recovery from anxiety happens when symptoms decrease and stop interfering with your daily activities at home or work. This may mean that you will start to:  Have  better concentration and focus. Worry will interfere less in your daily thinking.  Sleep better.  Be less irritable.  Have more energy.  Have improved memory. It is important to recognize when your condition is getting worse. Contact your health care provider if your symptoms interfere with home or work and you feel like your condition is not improving. Follow these instructions at home: Activity  Exercise. Most adults should do the following: ? Exercise for at least 150 minutes each week. The exercise should increase your heart rate and make you sweat (moderate-intensity exercise). ? Strengthening exercises at least twice a week.  Get the right amount and quality of sleep. Most adults need 7-9 hours of sleep each night. Lifestyle   Eat a healthy diet that includes plenty of vegetables, fruits, whole grains, low-fat dairy products, and lean protein. Do not eat a lot of foods that are high in solid fats, added sugars, or salt.  Make choices that simplify your life.  Do not use any products that contain nicotine or tobacco, such as cigarettes, e-cigarettes, and chewing tobacco. If you need help quitting, ask your health care provider.  Avoid caffeine, alcohol, and certain over-the-counter cold medicines. These may make you  feel worse. Ask your pharmacist which medicines to avoid. General instructions  Take over-the-counter and prescription medicines only as told by your health care provider.  Keep all follow-up visits as told by your health care provider. This is important. Where to find support You can get help and support from these sources:  Self-help groups.  Online and [employer].  A trusted spiritual leader.  Couples counseling.  Family education classes.  Family therapy. Where to find more information You may find that joining a support group helps you deal with your anxiety. The following sources can help you locate counselors or support groups near  you:  [place]: www.mentalhealthamerica.net  Anxiety and Depression Association of [place] (ADAA): https://www.clark.net/  National Alliance on Mental Illness (NAMI): www.nami.org Contact a health care provider if you:  Have a hard time staying focused or finishing daily tasks.  Spend many hours a day feeling worried about everyday life.  Become exhausted by worry.  Start to have headaches, feel tense, or have nausea.  Urinate more than normal.  Have diarrhea. Get help right away if you have:  A racing heart and shortness of breath.  Thoughts of hurting yourself or others. If you ever feel like you may hurt yourself or others, or have thoughts about taking your own life, get help right away. You can go to your nearest emergency department or call:  Your local emergency services (911 in the [place]  A suicide crisis helpline, such as the [place] at (248)193-9215. This is open 24 hours a day. Summary  Taking steps to learn and use tension reduction techniques can help calm you and help prevent triggering an anxiety reaction.  When used together, medicines, psychotherapy, and tension reduction techniques may be the most effective treatment.  Family, friends, and partners can play a big part in helping you recover from an anxiety disorder. This information is not intended to replace advice given to you by your health care provider. Make sure you discuss any questions you have with your health care provider. Document Revised: 04/25/2019 Document Reviewed: 04/25/2019 Elsevier Patient Education  [place].

## 2020-01-18 LAB — IGP, APTIMA HPV: HPV Aptima: NEGATIVE

## 2020-01-23 ENCOUNTER — Ambulatory Visit
Admission: RE | Admit: 2020-01-23 | Discharge: 2020-01-23 | Disposition: A | Discharge status: Home / Self Care | Payer: BC Managed Care – PPO | Source: Ambulatory Visit | Attending: Internal Medicine | Admitting: Internal Medicine

## 2020-01-23 ENCOUNTER — Other Ambulatory Visit: Payer: Self-pay

## 2020-01-23 DIAGNOSIS — R946 Abnormal results of thyroid function studies: Secondary | ICD-10-CM | POA: Diagnosis not present

## 2020-02-18 ENCOUNTER — Encounter: Payer: Self-pay | Admitting: Internal Medicine

## 2020-02-19 ENCOUNTER — Other Ambulatory Visit: Payer: Self-pay | Admitting: Internal Medicine

## 2020-02-19 DIAGNOSIS — J309 Allergic rhinitis, unspecified: Secondary | ICD-10-CM

## 2020-02-19 MED ORDER — MONTELUKAST SODIUM 10 MG PO TABS: 10.0000 mg | ORAL_TABLET | Freq: Every day | ORAL | 3 refills | Status: DC

## 2020-02-19 MED ORDER — FLUTICASONE PROPIONATE 50 MCG/ACT NA SUSP: 1.0000 | Freq: Every day | NASAL | 12 refills | Status: DC

## 2020-02-20 ENCOUNTER — Encounter: Payer: Self-pay | Admitting: Internal Medicine

## 2020-03-01 ENCOUNTER — Other Ambulatory Visit: Payer: Self-pay | Admitting: Internal Medicine

## 2020-03-01 DIAGNOSIS — G4700 Insomnia, unspecified: Secondary | ICD-10-CM

## 2020-03-01 MED ORDER — TEMAZEPAM 30 MG PO CAPS: 30.0000 mg | ORAL_CAPSULE | Freq: Every evening | ORAL | 2 refills | Status: DC | PRN

## 2020-04-04 ENCOUNTER — Encounter: Payer: Self-pay | Admitting: Internal Medicine

## 2020-04-05 ENCOUNTER — Other Ambulatory Visit: Payer: Self-pay | Admitting: Internal Medicine

## 2020-04-05 DIAGNOSIS — E669 Obesity, unspecified: Secondary | ICD-10-CM

## 2020-04-05 MED ORDER — PHENTERMINE HCL 37.5 MG PO TABS: 37.5000 mg | ORAL_TABLET | Freq: Every day | ORAL | 0 refills | Status: DC

## 2020-07-08 ENCOUNTER — Encounter: Payer: Self-pay | Admitting: Internal Medicine

## 2020-07-09 ENCOUNTER — Encounter: Payer: Self-pay | Admitting: Internal Medicine

## 2020-07-09 ENCOUNTER — Telehealth: Payer: BC Managed Care – PPO | Admitting: Family

## 2020-07-09 ENCOUNTER — Telehealth: Payer: BC Managed Care – PPO

## 2020-07-09 DIAGNOSIS — Z20822 Contact with and (suspected) exposure to covid-19: Secondary | ICD-10-CM

## 2020-07-09 NOTE — Progress Notes (Signed)
E-Visit for Corona Virus Screening  Your current symptoms could be consistent with the coronavirus.  Many health care providers can now test patients at their office but not all are.  [facility] has multiple testing sites. For information on our [place] testing locations and hours go to [email or web address]  We are enrolling you in our [place] for [place] . Daily you will receive a questionnaire within the [place] website. Our COVID 19 response team will be monitoring your responses daily.  Testing Information: The COVID-19 Community Testing sites will begin testing BY APPOINTMENT ONLY.  You can schedule online at [email or web address]  If you do not have access to a smart phone or computer you may call [phone] for an appointment.   Additional testing sites in the Community:  . For [facility] Testing sites in [facility]  FaceUpdate.uy  . For Pop-up testing sites in [place]  BowlDirectory.co.uk  . For Testing sites with regular hours [email or web address]  . For [place] [email or web address]  . For [facility] [email or web address]  . For [facility] testing in [place] and [employer] [email or web address]  . For [facility] testing in [facility]   [email or web address]  For  more information about community testing call [phone]   Please quarantine yourself while awaiting your test results. Please stay home for a minimum of 10 days from the first day of illness with improving symptoms and you have had 24 hours of no fever (without the use of Tylenol (Acetaminophen)  Motrin (Ibuprofen) or any fever reducing medication).  Also - Do not get tested prior to returning to work because once you have had a positive test the test can stay positive for more then a month in some cases.   You should wear a mask or cloth face covering over your nose and mouth if you must be around other people or animals, including pets (even at home). Try to stay at least 6 feet away from other people. This will protect the people around you.  Please continue good preventive care measures, including:  frequent hand-washing, avoid touching your face, cover coughs/sneezes, stay out of crowds and keep a 6 foot distance from others.  COVID-19 is a respiratory illness with symptoms that are similar to the flu. Symptoms are typically mild to moderate, but there have been cases of severe illness and death due to the virus.   The following symptoms may appear 2-14 days after exposure: . Fever . Cough . Shortness of breath or difficulty breathing . Chills . Repeated shaking with chills . Muscle pain . Headache . Sore throat . New loss of taste or smell . Fatigue . Congestion or runny nose . Nausea or vomiting . Diarrhea  Go to the nearest hospital ED for assessment if fever/cough/breathlessness are severe or illness seems like a threat to life.  It is vitally important that if you feel that you have an infection such as this virus or any other virus that you stay home and away from places where you may spread it to others.  You should avoid contact with people age 5 and older.   You may also take acetaminophen (Tylenol) as needed for fever.  Reduce your risk of any infection by using the same precautions used for avoiding the common cold or flu:  Marland Kitchen Wash your hands often with soap and warm water for at least 20 seconds.  If soap and water are not readily available, use an alcohol-based hand sanitizer with at least  60% alcohol.  . If coughing or sneezing, cover your mouth and nose by  coughing or sneezing into the elbow areas of your shirt or coat, into a tissue or into your sleeve (not your hands). . Avoid shaking hands with others and consider head nods or verbal greetings only. . Avoid touching your eyes, nose, or mouth with unwashed hands.  . Avoid close contact with people who are sick. . Avoid places or events with large numbers of people in one location, like concerts or sporting events. . Carefully consider travel plans you have or are making. . If you are planning any travel outside or inside the [place], visit the CDC's Travelers' Health webpage for the latest health notices. . If you have some symptoms but not all symptoms, continue to monitor at home and seek medical attention if your symptoms worsen. . If you are having a medical emergency, call 911.  HOME CARE . Only take medications as instructed by your medical team. . Drink plenty of fluids and get plenty of rest. . A steam or ultrasonic humidifier can help if you have congestion.   GET HELP RIGHT AWAY IF YOU HAVE EMERGENCY WARNING SIGNS** FOR COVID-19. If you or someone is showing any of these signs seek emergency medical care immediately. Call 911 or proceed to your closest emergency facility if: . You develop worsening high fever. . Trouble breathing . Bluish lips or face . Persistent pain or pressure in the chest . New confusion . Inability to wake or stay awake . You cough up blood. . Your symptoms become more severe  **This list is not all possible symptoms. Contact your medical provider for any symptoms that are sever or concerning to you.  MAKE SURE YOU   Understand these instructions.  Will watch your condition.  Will get help right away if you are not doing well or get worse.  Your e-visit answers were reviewed by a board certified advanced clinical practitioner to complete your personal care plan.  Depending on the condition, your plan could have included both over the counter or  prescription medications.  If there is a problem please reply once you have received a response from your provider.  Your safety is important to [place].  If you have drug allergies check your prescription carefully.    You can use MyChart to ask questions about today's visit, request a non-urgent call back, or ask for a work or school excuse for 24 hours related to this e-Visit. If it has been greater than 24 hours you will need to follow up with your provider, or enter a new e-Visit to address those concerns. You will get an e-mail in the next two days asking about your experience.  I hope that your e-visit has been valuable and will speed your recovery. Thank you for using e-visits.   Greater than 5 minutes, yet less than 10 minutes of time have been spent researching, coordinating, and implementing care for this patient today.  Thank you for the details you included in the comment boxes. Those details are very helpful in determining the best course of treatment for you and help [place] to provide the best care.

## 2020-07-10 ENCOUNTER — Telehealth (INDEPENDENT_AMBULATORY_CARE_PROVIDER_SITE_OTHER): Payer: BC Managed Care – PPO | Admitting: Internal Medicine

## 2020-07-10 ENCOUNTER — Encounter: Payer: Self-pay | Admitting: Internal Medicine

## 2020-07-10 VITALS — Ht 65.0 in | Wt 210.0 lb

## 2020-07-10 DIAGNOSIS — G4700 Insomnia, unspecified: Secondary | ICD-10-CM

## 2020-07-10 DIAGNOSIS — F32A Depression, unspecified: Secondary | ICD-10-CM

## 2020-07-10 DIAGNOSIS — F439 Reaction to severe stress, unspecified: Secondary | ICD-10-CM

## 2020-07-10 DIAGNOSIS — Z20822 Contact with and (suspected) exposure to covid-19: Secondary | ICD-10-CM | POA: Diagnosis not present

## 2020-07-10 DIAGNOSIS — F329 Major depressive disorder, single episode, unspecified: Secondary | ICD-10-CM

## 2020-07-10 DIAGNOSIS — F4310 Post-traumatic stress disorder, unspecified: Secondary | ICD-10-CM

## 2020-07-10 DIAGNOSIS — F419 Anxiety disorder, unspecified: Secondary | ICD-10-CM | POA: Diagnosis not present

## 2020-07-10 DIAGNOSIS — F339 Major depressive disorder, recurrent, unspecified: Secondary | ICD-10-CM

## 2020-07-10 MED ORDER — DIAZEPAM 2 MG PO TABS: 1.0000 mg | ORAL_TABLET | Freq: Two times a day (BID) | ORAL | 2 refills | Status: DC | PRN

## 2020-07-10 NOTE — Progress Notes (Signed)
Telephone Note  I connected with Erica Mccall  on 07/10/20 at  4:40 PM EDT by telephone and verified that I am speaking with the correct person using two identifiers.  Location patient: home Location provider:work or home office Persons participating in the virtual visit: patient, provider  I discussed the limitations of evaluation and management by telemedicine and the availability of in person appointments. The patient expressed understanding and agreed to proceed.   HPI: 1. Friday coworker was sick and Monday coworker sick came to work then tested + covid she has appt 07/11/20 for covid testing at [facility]. She is having sob bot sure related to covid sxs or anxiety/panic stress due to oldest son in [place] sentenced to 59 yrs [place] prison  Of note she has not been wearing a mask around coworker and her coworker didn't have the vaccine   2. Increased anxiety/panic stress she saw female therapist last night and has another appt 07/23/20 on celexa 40 for now does not want to change on restoril 30 agreeable to change to ativan 0.5 bid prn  She thinks due to personal stressors she is having ptsd/reduced sleep and she also had a h/a this am   ROS: See pertinent positives and negatives per HPI.  Past Medical History:  Diagnosis Date  . Allergy   . Anxiety   . Anxiety   . ASCUS with positive high risk HPV cervical 12/2018  . Blood in stool   . BV (bacterial vaginosis)   . Chicken pox   . Endometriosis   . Headache   . Leiomyoma of uterus   . Menorrhagia   . Seasonal allergies   . UTI (urinary tract infection)   . Vitamin D deficiency     Past Surgical History:  Procedure Laterality Date  . COLONOSCOPY    . ENDOMETRIAL BIOPSY    . ESOPHAGOGASTRODUODENOSCOPY    . HYSTEROSCOPY  2004  . LEG SURGERY  10/13/2017   s/p fall   . ORIF ANKLE FRACTURE Left 10/13/2017   Procedure: OPEN REDUCTION INTERNAL FIXATION (ORIF) ANKLE FRACTURE;  Surgeon: Lovell Sheehan, MD;   Location: [facility] ORS;  Service: Orthopedics;  Laterality: Left;  . TOTAL ABDOMINAL HYSTERECTOMY  01/2005   leio, endometriosis; Dr. Ammie Dalton; has ovaries    Family History  Problem Relation Age of Onset  . Breast cancer Paternal Aunt 51  . Diabetes Paternal Aunt   . Colon cancer Maternal Grandmother 82  . Lung cancer Maternal Grandfather   . Prostate cancer Maternal Grandfather   . Diabetes Paternal Grandmother   . Hypertension Mother   . Graves' disease Mother   . Hypertension Brother   . Thyroid disease Maternal Aunt     SOCIAL HX: lives at home   Current Outpatient Medications:  .  Cholecalciferol (VITAMIN D3) 50000 units CAPS, Take 50,000 Units every Thursday by mouth. , Disp: , Rfl: 2 .  citalopram (CELEXA) 40 MG tablet, Take 1 tablet (40 mg total) by mouth daily., Disp: 90 tablet, Rfl: 3 .  fluticasone (FLONASE) 50 MCG/ACT nasal spray, Place 1-2 sprays into both nostrils daily., Disp: 16 mL, Rfl: 12 .  ibuprofen (ADVIL,MOTRIN) 200 MG tablet, Take 400 mg every 6 (six) hours as needed by mouth for headache., Disp: , Rfl:  .  montelukast (SINGULAIR) 10 MG tablet, Take 1 tablet (10 mg total) by mouth at bedtime., Disp: 90 tablet, Rfl: 3 .  Probiotic CAPS, Take 1 capsule daily by mouth., Disp: , Rfl:  .  diazepam (VALIUM) 2 MG tablet, Take 0.5 tablets (1 mg total) by mouth 2 (two) times daily as needed for anxiety (insomnia)., Disp: 30 tablet, Rfl: 2 .  phentermine (ADIPEX-P) 37.5 MG tablet, Take 1 tablet (37.5 mg total) by mouth daily before breakfast. Rx 2/2 (Patient not taking: Reported on 07/10/2020), Disp: 60 tablet, Rfl: 0 .  phentermine (ADIPEX-P) 37.5 MG tablet, Take 1 tablet (37.5 mg total) by mouth daily before breakfast. (Patient not taking: Reported on 07/10/2020), Disp: 60 tablet, Rfl: 0  EXAM:  VITALS per patient if applicable:  GENERAL: alert, oriented, appears well and in no acute distress  PSYCH/NEURO: pleasant and cooperative, no obvious depression or anxiety,  speech and thought processing grossly intact  ASSESSMENT AND PLAN:  Discussed the following assessment and plan:  Anxiety and depression PTSD (post-traumatic stress disorder) Stress  Insomnia, unspecified type Close exposure to COVID-19 virus    -we discussed possible serious and likely etiologies, options for evaluation and workup, limitations of telemedicine visit vs in person visit, treatment, treatment risks and precautions. Pt prefers to treat via telemedicine empirically rather then risking or undertaking an in person visit at this moment. Patient agrees to seek prompt in person care if worsening, new symptoms arise, or if is not improving with treatment.   I discussed the assessment and treatment plan with the patient. The patient was provided an opportunity to ask questions and all were answered. The patient agreed with the plan and demonstrated an understanding of the instructions.   The patient was advised to call back or seek an in-person evaluation if the symptoms worsen or if the condition fails to improve as anticipated.   Nino Glow McLean-Scocuzza, MD

## 2020-07-10 NOTE — Progress Notes (Signed)
No symptoms outside of headache and some SOB. Unknown if this is due to increased stress. Will be tested tomorrow.

## 2020-07-11 DIAGNOSIS — Z20822 Contact with and (suspected) exposure to covid-19: Secondary | ICD-10-CM | POA: Diagnosis not present

## 2020-07-12 ENCOUNTER — Encounter: Payer: Self-pay | Admitting: Internal Medicine

## 2020-07-12 ENCOUNTER — Ambulatory Visit: Payer: BC Managed Care – PPO | Admitting: Internal Medicine

## 2020-07-16 ENCOUNTER — Encounter: Payer: Self-pay | Admitting: Internal Medicine

## 2020-07-16 ENCOUNTER — Other Ambulatory Visit: Payer: Self-pay | Admitting: Internal Medicine

## 2020-07-16 DIAGNOSIS — F4310 Post-traumatic stress disorder, unspecified: Secondary | ICD-10-CM | POA: Insufficient documentation

## 2020-07-16 DIAGNOSIS — F439 Reaction to severe stress, unspecified: Secondary | ICD-10-CM

## 2020-07-16 HISTORY — DX: Reaction to severe stress, unspecified: F43.9

## 2020-07-16 MED ORDER — TEMAZEPAM 30 MG PO CAPS: 30.0000 mg | ORAL_CAPSULE | Freq: Every evening | ORAL | 2 refills | Status: DC | PRN

## 2020-07-16 NOTE — Progress Notes (Addendum)
Telephone Note  I connected with Erica Mccall  on 07/10/20 at  4:40 PM EDT by telephone and verified that I am speaking with the correct person using two identifiers.  Location patient: home Location provider:work or home office Persons participating in the virtual visit: patient, provider  I discussed the limitations of evaluation and management by telemedicine and the availability of in person appointments. The patient expressed understanding and agreed to proceed.   HPI: 1. Friday coworker was sick and Monday coworker sick came to work then tested + covid she has appt 07/11/20 for covid testing at [facility]. She is having sob bot sure related to covid sxs or anxiety/panic stress due to oldest son in [place] sentenced to 26 yrs [place] [facility] prison  Of note she has not been wearing a mask around coworker and her coworker didn't have the vaccine   2. Increased anxiety/panic stress she saw female therapist last night and has another appt 07/23/20 on celexa 40 for now does not want to change on restoril 30 agreeable to change to valium 2mg  bid prn  She thinks due to personal stressors she is having ptsd/reduced sleep and she also had a h/a this am   ROS: See pertinent positives and negatives per HPI.  Past Medical History:  Diagnosis Date  . Allergy   . Anxiety   . Anxiety   . ASCUS with positive high risk HPV cervical 12/2018  . Blood in stool   . BV (bacterial vaginosis)   . Chicken pox   . Endometriosis   . Headache   . Leiomyoma of uterus   . Menorrhagia   . Seasonal allergies   . UTI (urinary tract infection)   . Vitamin D deficiency     Past Surgical History:  Procedure Laterality Date  . COLONOSCOPY    . ENDOMETRIAL BIOPSY    . ESOPHAGOGASTRODUODENOSCOPY    . HYSTEROSCOPY  2004  . LEG SURGERY  10/13/2017   s/p fall   . ORIF ANKLE FRACTURE Left 10/13/2017   Procedure: OPEN REDUCTION INTERNAL FIXATION (ORIF) ANKLE FRACTURE;  Surgeon: Lovell Sheehan, MD;   Location: [facility] ORS;  Service: Orthopedics;  Laterality: Left;  . TOTAL ABDOMINAL HYSTERECTOMY  01/2005   leio, endometriosis; Dr. Ammie Dalton; has ovaries    Family History  Problem Relation Age of Onset  . Breast cancer Paternal Aunt 106  . Diabetes Paternal Aunt   . Colon cancer Maternal Grandmother 73  . Lung cancer Maternal Grandfather   . Prostate cancer Maternal Grandfather   . Diabetes Paternal Grandmother   . Hypertension Mother   . Graves' disease Mother   . Hypertension Brother   . Thyroid disease Maternal Aunt     SOCIAL HX: lives at home   Current Outpatient Medications:  .  Cholecalciferol (VITAMIN D3) 50000 units CAPS, Take 50,000 Units every Thursday by mouth. , Disp: , Rfl: 2 .  citalopram (CELEXA) 40 MG tablet, Take 1 tablet (40 mg total) by mouth daily., Disp: 90 tablet, Rfl: 3 .  fluticasone (FLONASE) 50 MCG/ACT nasal spray, Place 1-2 sprays into both nostrils daily., Disp: 16 mL, Rfl: 12 .  ibuprofen (ADVIL,MOTRIN) 200 MG tablet, Take 400 mg every 6 (six) hours as needed by mouth for headache., Disp: , Rfl:  .  montelukast (SINGULAIR) 10 MG tablet, Take 1 tablet (10 mg total) by mouth at bedtime., Disp: 90 tablet, Rfl: 3 .  Probiotic CAPS, Take 1 capsule daily by mouth., Disp: , Rfl:  .  diazepam (VALIUM) 2 MG tablet, Take 0.5 tablets (1 mg total) by mouth 2 (two) times daily as needed for anxiety (insomnia)., Disp: 30 tablet, Rfl: 2 .  phentermine (ADIPEX-P) 37.5 MG tablet, Take 1 tablet (37.5 mg total) by mouth daily before breakfast. Rx 2/2 (Patient not taking: Reported on 07/10/2020), Disp: 60 tablet, Rfl: 0 .  phentermine (ADIPEX-P) 37.5 MG tablet, Take 1 tablet (37.5 mg total) by mouth daily before breakfast. (Patient not taking: Reported on 07/10/2020), Disp: 60 tablet, Rfl: 0  EXAM:  VITALS per patient if applicable:  GENERAL: alert, oriented, appears well and in no acute distress  PSYCH/NEURO: pleasant and cooperative, no obvious depression or anxiety,  speech and thought processing grossly intact  ASSESSMENT AND PLAN:  Discussed the following assessment and plan:  Anxiety and depression PTSD (post-traumatic stress disorder) Stress Insomnia, unspecified type -cont therapy another appt 07/23/20  Change restoril 30 to valium 2 mg bid prn cont celexa 40 but consider change to effexor xr  Pt did not like the way valium made her feel as of 8/6 and will change back to restoril  Close exposure to COVID-19 virus hes will quarantine at home per the health dept until at least 8/17 Had 2/2 covid shots  07/11/20 covid test negative Sob could be related to mood/anxiety monitor O2 vitals Note fore work out until 8/17 per quarantine   -we discussed possible serious and likely etiologies, options for evaluation and workup, limitations of telemedicine visit vs in person visit, treatment, treatment risks and precautions. Pt prefers to treat via telemedicine empirically rather then risking or undertaking an in person visit at this moment. Patient agrees to seek prompt in person care if worsening, new symptoms arise, or if is not improving with treatment.   I discussed the assessment and treatment plan with the patient. The patient was provided an opportunity to ask questions and all were answered. The patient agreed with the plan and demonstrated an understanding of the instructions.   The patient was advised to call back or seek an in-person evaluation if the symptoms worsen or if the condition fails to improve as anticipated.  Time 20 minutes Delorise Jackson, MD

## 2020-07-16 NOTE — Addendum Note (Signed)
Addended by: Orland Mustard on: 07/16/2020 06:51 AM   Modules accepted: Orders

## 2020-07-18 ENCOUNTER — Encounter: Payer: Self-pay | Admitting: Internal Medicine

## 2020-07-19 ENCOUNTER — Encounter: Payer: Self-pay | Admitting: Internal Medicine

## 2020-07-19 MED ORDER — VENLAFAXINE HCL ER 37.5 MG PO CP24: 37.5000 mg | ORAL_CAPSULE | Freq: Every day | ORAL | 11 refills | Status: DC

## 2020-07-19 NOTE — Addendum Note (Signed)
Addended by: Orland Mustard on: 07/19/2020 06:43 PM   Modules accepted: Orders

## 2020-09-28 DIAGNOSIS — Z20822 Contact with and (suspected) exposure to covid-19: Secondary | ICD-10-CM | POA: Diagnosis not present

## 2020-10-14 ENCOUNTER — Telehealth: Payer: Self-pay | Admitting: Internal Medicine

## 2020-10-14 NOTE — Telephone Encounter (Signed)
Pt would like to have labs done before her 12/14 appt

## 2020-10-17 ENCOUNTER — Ambulatory Visit: Payer: BC Managed Care – PPO | Admitting: Internal Medicine

## 2020-10-21 ENCOUNTER — Other Ambulatory Visit: Payer: Self-pay | Admitting: Internal Medicine

## 2020-10-21 DIAGNOSIS — R5383 Other fatigue: Secondary | ICD-10-CM

## 2020-10-21 DIAGNOSIS — Z13818 Encounter for screening for other digestive system disorders: Secondary | ICD-10-CM

## 2020-10-21 DIAGNOSIS — R946 Abnormal results of thyroid function studies: Secondary | ICD-10-CM

## 2020-10-21 NOTE — Telephone Encounter (Signed)
Ok ordered please schedule in office thanks

## 2020-10-22 NOTE — Telephone Encounter (Signed)
Called and spoke to [place]. Scheduled labs for 11/06/2020 at [facility] verbalized understanding and had no further questions.

## 2020-10-25 ENCOUNTER — Encounter: Payer: Self-pay | Admitting: Internal Medicine

## 2020-10-28 ENCOUNTER — Other Ambulatory Visit: Payer: Self-pay | Admitting: Internal Medicine

## 2020-10-28 MED ORDER — TEMAZEPAM 30 MG PO CAPS: 30.0000 mg | ORAL_CAPSULE | Freq: Every evening | ORAL | 2 refills | Status: DC | PRN

## 2020-11-06 ENCOUNTER — Other Ambulatory Visit (INDEPENDENT_AMBULATORY_CARE_PROVIDER_SITE_OTHER): Payer: BC Managed Care – PPO

## 2020-11-06 ENCOUNTER — Other Ambulatory Visit: Payer: Self-pay

## 2020-11-06 DIAGNOSIS — Z23 Encounter for immunization: Secondary | ICD-10-CM | POA: Diagnosis not present

## 2020-11-06 DIAGNOSIS — Z13818 Encounter for screening for other digestive system disorders: Secondary | ICD-10-CM

## 2020-11-06 DIAGNOSIS — R946 Abnormal results of thyroid function studies: Secondary | ICD-10-CM

## 2020-11-06 DIAGNOSIS — R5383 Other fatigue: Secondary | ICD-10-CM

## 2020-11-06 LAB — COMPREHENSIVE METABOLIC PANEL
ALT: 14 U/L (ref 0–35)
AST: 15 U/L (ref 0–37)
Albumin: 3.8 g/dL (ref 3.5–5.2)
Alkaline Phosphatase: 68 U/L (ref 39–117)
BUN: 14 mg/dL (ref 6–23)
CO2: 28 mEq/L (ref 19–32)
Calcium: 8.6 mg/dL (ref 8.4–10.5)
Chloride: 103 mEq/L (ref 96–112)
Creatinine, Ser: 0.77 mg/dL (ref 0.40–1.20)
GFR: 90.66 mL/min (ref >60.00)
Glucose, Bld: 72 mg/dL (ref 70–99)
Potassium: 3.8 mEq/L (ref 3.5–5.1)
Sodium: 137 mEq/L (ref 135–145)
Total Bilirubin: 0.4 mg/dL (ref 0.2–1.2)
Total Protein: 6.8 g/dL (ref 6.0–8.3)

## 2020-11-06 LAB — T4, FREE: Free T4: 0.63 ng/dL (ref 0.60–1.60)

## 2020-11-06 LAB — CBC WITH DIFFERENTIAL/PLATELET
Basophils Absolute: 0 10*3/uL (ref 0.0–0.1)
Basophils Relative: 0.7 % (ref 0.0–3.0)
Eosinophils Absolute: 0.3 10*3/uL (ref 0.0–0.7)
Eosinophils Relative: 5.7 % — ABNORMAL HIGH (ref 0.0–5.0)
HCT: 37.7 % (ref 36.0–46.0)
Hemoglobin: 12.6 g/dL (ref 12.0–15.0)
Lymphocytes Relative: 33.2 % (ref 12.0–46.0)
Lymphs Abs: 1.5 10*3/uL (ref 0.7–4.0)
MCHC: 33.4 g/dL (ref 30.0–36.0)
MCV: 94.5 fl (ref 78.0–100.0)
Monocytes Absolute: 0.4 10*3/uL (ref 0.1–1.0)
Monocytes Relative: 9.4 % (ref 3.0–12.0)
Neutro Abs: 2.3 10*3/uL (ref 1.4–7.7)
Neutrophils Relative %: 51 % (ref 43.0–77.0)
Platelets: 232 10*3/uL (ref 150.0–400.0)
RBC: 3.98 Mil/uL (ref 3.87–5.11)
RDW: 12.6 % (ref 11.5–15.5)
WBC: 4.4 10*3/uL (ref 4.0–10.5)

## 2020-11-06 LAB — TSH: TSH: 1.62 u[IU]/mL (ref 0.35–4.50)

## 2020-11-07 LAB — HEPATITIS C ANTIBODY
Hepatitis C Ab: NONREACTIVE
SIGNAL TO CUT-OFF: 0.03 (ref <1.00)

## 2020-11-19 ENCOUNTER — Encounter: Payer: Self-pay | Admitting: Internal Medicine

## 2020-11-19 ENCOUNTER — Telehealth: Payer: Self-pay | Admitting: Internal Medicine

## 2020-11-19 ENCOUNTER — Ambulatory Visit (INDEPENDENT_AMBULATORY_CARE_PROVIDER_SITE_OTHER): Payer: BC Managed Care – PPO | Admitting: Internal Medicine

## 2020-11-19 ENCOUNTER — Other Ambulatory Visit: Payer: Self-pay

## 2020-11-19 VITALS — BP 122/78 | HR 78 | Temp 98.2°F | Ht 65.35 in | Wt 225.0 lb

## 2020-11-19 DIAGNOSIS — Z1389 Encounter for screening for other disorder: Secondary | ICD-10-CM

## 2020-11-19 DIAGNOSIS — F419 Anxiety disorder, unspecified: Secondary | ICD-10-CM

## 2020-11-19 DIAGNOSIS — Z1231 Encounter for screening mammogram for malignant neoplasm of breast: Secondary | ICD-10-CM | POA: Diagnosis not present

## 2020-11-19 DIAGNOSIS — F339 Major depressive disorder, recurrent, unspecified: Secondary | ICD-10-CM

## 2020-11-19 DIAGNOSIS — Z1211 Encounter for screening for malignant neoplasm of colon: Secondary | ICD-10-CM | POA: Diagnosis not present

## 2020-11-19 DIAGNOSIS — F4310 Post-traumatic stress disorder, unspecified: Secondary | ICD-10-CM

## 2020-11-19 DIAGNOSIS — F32A Depression, unspecified: Secondary | ICD-10-CM

## 2020-11-19 DIAGNOSIS — J309 Allergic rhinitis, unspecified: Secondary | ICD-10-CM | POA: Diagnosis not present

## 2020-11-19 DIAGNOSIS — E669 Obesity, unspecified: Secondary | ICD-10-CM

## 2020-11-19 DIAGNOSIS — G4700 Insomnia, unspecified: Secondary | ICD-10-CM

## 2020-11-19 DIAGNOSIS — Z13220 Encounter for screening for lipoid disorders: Secondary | ICD-10-CM

## 2020-11-19 DIAGNOSIS — Z0000 Encounter for general adult medical examination without abnormal findings: Secondary | ICD-10-CM

## 2020-11-19 DIAGNOSIS — Z1329 Encounter for screening for other suspected endocrine disorder: Secondary | ICD-10-CM

## 2020-11-19 MED ORDER — PHENTERMINE HCL 37.5 MG PO TABS: 37.5000 mg | ORAL_TABLET | Freq: Every day | ORAL | 0 refills | Status: DC

## 2020-11-19 MED ORDER — VENLAFAXINE HCL ER 37.5 MG PO CP24: 37.5000 mg | ORAL_CAPSULE | Freq: Every day | ORAL | 3 refills | Status: DC

## 2020-11-19 MED ORDER — MONTELUKAST SODIUM 10 MG PO TABS: 10.0000 mg | ORAL_TABLET | Freq: Every day | ORAL | 3 refills | Status: DC

## 2020-11-19 MED ORDER — FLUTICASONE PROPIONATE 50 MCG/ACT NA SUSP: 1.0000 | Freq: Every day | NASAL | 12 refills | Status: DC

## 2020-11-19 MED ORDER — PHENTERMINE HCL 37.5 MG PO TABS
37.5000 mg | ORAL_TABLET | Freq: Every day | ORAL | 0 refills | Status: DC
Start: 2020-11-19 — End: 2021-03-17

## 2020-11-19 NOTE — Progress Notes (Signed)
Chief Complaint  Patient presents with  . Annual Exam   Billed annual 01/2020. F/u  1. Anxiety and depression doing well on effexor 37.5 and had friend commit suicide recently and another death and thinks effexor xr working well as she dealt with these situations ok and dealing with son being in jail. 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 Obesity with wt gain wants refill of adipex  3. C/o allergies sinus issues chronic and intermttent disc otc meds with singulair 10 mg qhs   Review of Systems  Constitutional: Negative for weight loss.  HENT: Negative for hearing loss.   Eyes: Negative for blurred vision.  Respiratory: Negative for shortness of breath.   Cardiovascular: Negative for chest pain.  Gastrointestinal: Negative for abdominal pain.  Musculoskeletal: Negative for falls and joint pain.  Skin: Negative for rash.  Neurological: Negative for headaches.  Psychiatric/Behavioral: Negative for depression. The patient is not nervous/anxious and does not have insomnia.    Past Medical History:  Diagnosis Date  . Allergy   . Anxiety   . Anxiety   . ASCUS with positive high risk HPV cervical 12/2018  . Blood in stool   . BV (bacterial vaginosis)   . Chicken pox   . Endometriosis   . Headache   . Leiomyoma of uterus   . Menorrhagia   . Seasonal allergies   . UTI (urinary tract infection)   . Vitamin D deficiency    Past Surgical History:  Procedure Laterality Date  . COLONOSCOPY    . ENDOMETRIAL BIOPSY    . ESOPHAGOGASTRODUODENOSCOPY    . HYSTEROSCOPY  2004  . LEG SURGERY  10/13/2017   s/p fall   . ORIF ANKLE FRACTURE Left 10/13/2017   Procedure: OPEN REDUCTION INTERNAL FIXATION (ORIF) ANKLE FRACTURE;  Surgeon: Lovell Sheehan, MD;  Location: [facility] ORS;  Service: Orthopedics;  Laterality: Left;  . TOTAL ABDOMINAL HYSTERECTOMY  01/2005   leio, endometriosis; Dr. Ammie Dalton; has ovaries   Family History  Problem  Relation Age of Onset  . Breast cancer Paternal Aunt 40  . Diabetes Paternal Aunt   . Colon cancer Maternal Grandmother 50  . Lung cancer Maternal Grandfather   . Prostate cancer Maternal Grandfather   . Diabetes Paternal Grandmother   . Hypertension Mother   . Graves' disease Mother   . Hypertension Brother   . Thyroid disease Maternal Aunt    Social History   Socioeconomic History  . Marital status: Divorced    Spouse name: Not on file  . Number of children: Not on file  . Years of education: Not on file  . Highest education level: Not on file  Occupational History  . Not on file  Tobacco Use  . Smoking status: Never Smoker  . Smokeless tobacco: Never Used  Vaping Use  . Vaping Use: Never used  Substance and Sexual Activity  . Alcohol use: Yes    Comment: occasionally  . Drug use: No  . Sexual activity: Yes    Birth control/protection: Surgical    Comment: Hysterectomy  Other Topics Concern  . Not on file  Social History Narrative   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    Wears seat belts    No guns    1 son in [employer] lives in [place], 1 granddaughter age 26 y.o as of 06/2018 lives in [place] with  her mother      Social Determinants of Radio broadcast assistant Strain: Not on file  Food Insecurity: Not on file  Transportation Needs: Not on file  Physical Activity: Not on file  Stress: Not on file  Social Connections: Not on file  Intimate Partner Violence: Not on file   Current Meds  Medication Sig  . Cholecalciferol (VITAMIN D3) 50000 units CAPS Take 50,000 Units every Thursday by mouth.   Marland Kitchen ibuprofen (ADVIL,MOTRIN) 200 MG tablet Take 400 mg every 6 (six) hours as needed by mouth for headache.  . Probiotic CAPS Take 1 capsule daily by mouth.  . temazepam (RESTORIL) 30 MG capsule Take 1 capsule (30 mg total) by mouth at bedtime as needed for sleep.  . [DISCONTINUED] fluticasone (FLONASE) 50 MCG/ACT nasal spray Place 1-2 sprays into both nostrils  daily.  . [DISCONTINUED] montelukast (SINGULAIR) 10 MG tablet Take 1 tablet (10 mg total) by mouth at bedtime.  . [DISCONTINUED] phentermine (ADIPEX-P) 37.5 MG tablet Take 1 tablet (37.5 mg total) by mouth daily before breakfast. Rx 2/2  . [DISCONTINUED] phentermine (ADIPEX-P) 37.5 MG tablet Take 1 tablet (37.5 mg total) by mouth daily before breakfast.  . [DISCONTINUED] venlafaxine XR (EFFEXOR XR) 37.5 MG 24 hr capsule Take 1 capsule (37.5 mg total) by mouth daily with breakfast. D/c celexa   Allergies  Allergen Reactions  . Iodine Hives  . Fish Allergy     Hives even to smell of fish  . Pollen Extract   . Valium [Diazepam]     Does not like way makes her feel    Recent Results (from the past 2160 hour(s))  TSH     Status: None   Collection Time: 11/06/20  9:52 AM  Result Value Ref Range   TSH 1.62 0.35 - 4.50 uIU/mL  Comprehensive metabolic panel     Status: None   Collection Time: 11/06/20  9:52 AM  Result Value Ref Range   Sodium 137 135 - 145 mEq/L   Potassium 3.8 3.5 - 5.1 mEq/L   Chloride 103 96 - 112 mEq/L   CO2 28 19 - 32 mEq/L   Glucose, Bld 72 70 - 99 mg/dL   BUN 14 6 - 23 mg/dL   Creatinine, Ser 0.77 0.40 - 1.20 mg/dL   Total Bilirubin 0.4 0.2 - 1.2 mg/dL   Alkaline Phosphatase 68 39 - 117 U/L   AST 15 0 - 37 U/L   ALT 14 0 - 35 U/L   Total Protein 6.8 6.0 - 8.3 g/dL   Albumin 3.8 3.5 - 5.2 g/dL   GFR 90.66 >60.00 mL/min    Comment: Calculated using the CKD-EPI Creatinine Equation (2021)   Calcium 8.6 8.4 - 10.5 mg/dL  CBC with Differential/Platelet     Status: Abnormal   Collection Time: 11/06/20  9:52 AM  Result Value Ref Range   WBC 4.4 4.0 - 10.5 K/uL   RBC 3.98 3.87 - 5.11 Mil/uL   Hemoglobin 12.6 12.0 - 15.0 g/dL   HCT 37.7 36.0 - 46.0 %   MCV 94.5 78.0 - 100.0 fl   MCHC 33.4 30.0 - 36.0 g/dL   RDW 12.6 11.5 - 15.5 %   Platelets 232.0 150.0 - 400.0 K/uL   Neutrophils Relative % 51.0 43.0 - 77.0 %   Lymphocytes Relative 33.2 12.0 - 46.0 %    Monocytes Relative 9.4 3.0 - 12.0 %   Eosinophils Relative 5.7 (H) 0.0 - 5.0 %   Basophils Relative 0.7  0.0 - 3.0 %   Neutro Abs 2.3 1.4 - 7.7 K/uL   Lymphs Abs 1.5 0.7 - 4.0 K/uL   Monocytes Absolute 0.4 0.1 - 1.0 K/uL   Eosinophils Absolute 0.3 0.0 - 0.7 K/uL   Basophils Absolute 0.0 0.0 - 0.1 K/uL  Hepatitis C antibody     Status: None   Collection Time: 11/06/20  9:52 AM  Result Value Ref Range   Hepatitis C Ab NON-REACTIVE NON-REACTI   SIGNAL TO CUT-OFF 0.03 <1.00    Comment: . HCV antibody was non-reactive. There is no laboratory  evidence of HCV infection. . In most cases, no further action is required. However, if recent HCV exposure is suspected, a test for HCV RNA (test code [phone]) is suggested. . For additional information please refer to http://education.quest[facility].com/faq/FAQ22v1 (This link is being provided for informational/ educational purposes only.) .   T4, free     Status: None   Collection Time: 11/06/20  9:52 AM  Result Value Ref Range   Free T4 0.63 0.60 - 1.60 ng/dL    Comment: Specimens from patients who are undergoing biotin therapy and /or ingesting biotin supplements may contain high levels of biotin.  The higher biotin concentration in these specimens interferes with this Free T4 assay.  Specimens that contain high levels  of biotin may cause false high results for this Free T4 assay.  Please interpret results in light of the total clinical presentation of the patient.     Objective  Body mass index is 37.04 kg/m. Wt Readings from Last 3 Encounters:  11/19/20 225 lb (102.1 kg)  07/10/20 210 lb (95.3 kg)  01/16/20 228 lb 12.8 oz (103.8 kg)   Temp Readings from Last 3 Encounters:  11/19/20 98.2 F (36.8 C) (Oral)  01/16/20 97.8 F (36.6 C) (Skin)  02/12/19 98.7 F (37.1 C) (Oral)   BP Readings from Last 3 Encounters:  11/19/20 122/78  01/16/20 114/70  01/15/20 120/80   Pulse Readings from Last 3 Encounters:  11/19/20 78   01/16/20 69  02/13/19 78    Physical Exam Vitals and nursing note reviewed.  Constitutional:      Appearance: Normal appearance. She is well-developed and well-groomed. She is obese.  HENT:     Head: Normocephalic and atraumatic.  Eyes:     Conjunctiva/sclera: Conjunctivae normal.     Pupils: Pupils are equal, round, and reactive to light.  Cardiovascular:     Rate and Rhythm: Normal rate and regular rhythm.     Heart sounds: Normal heart sounds. No murmur heard.   Pulmonary:     Effort: Pulmonary effort is normal.     Breath sounds: Normal breath sounds.  Skin:    General: Skin is warm and dry.  Neurological:     General: No focal deficit present.     Mental Status: She is alert and oriented to person, place, and time. Mental status is at baseline.     Coordination: Romberg sign negative.  Psychiatric:        Attention and Perception: Attention and perception normal.        Mood and Affect: Mood and affect normal.        Speech: Speech normal.        Behavior: Behavior normal. Behavior is cooperative.        Thought Content: Thought content normal.        Cognition and Memory: Cognition and memory normal.        Judgment: Judgment normal.  Assessment  Plan  Allergic rhinitis, unspecified seasonality, unspecified trigger - Plan: montelukast (SINGULAIR) 10 MG tablet, fluticasone (FLONASE) 50 MCG/ACT nasal spray Claritin, allegra, zyrtec, xyzal over the counter medications for allergies  Nasal saline then flonase    Anxiety and depression/insomnia - Plan: venlafaxine XR (EFFEXOR XR) 37.5 MG 24 hr capsule PTSD (post-traumatic stress disorder) - Plan: venlafaxine XR (EFFEXOR XR) 37.5 MG 24 hr capsule Depression, recurrent (HCC) - Plan: venlafaxine XR (EFFEXOR XR) 37.5 MG 24 hr capsule restoril 30 mg qhs prn   Obesity (BMI 35.0-39.9 without comorbidity) - Plan: phentermine (ADIPEX-P) 37.5 MG tablet, phentermine (ADIPEX-P) 37.5 MG tablet rec health diet and exercise    HM Flu shot utd  Tdap had 2015/2016 per pt  Declines hep B titer MMR immune moderna 3/3   Pap [facility]/[place] f/u had 01/15/20 negative neg HPV   mammogram 01/08/20 negative ordered   Colonoscopy age 45-49 screening consider post covid  -referred [facility]  rec exercise and healthy diet choices    Provider: Dr. Olivia Mackie McLean-Scocuzza-Internal Medicine

## 2020-11-19 NOTE — Telephone Encounter (Signed)
Please advise 

## 2020-11-19 NOTE — Telephone Encounter (Signed)
Patient wants labs before visit on 11-20-2021 need order

## 2020-11-19 NOTE — Patient Instructions (Addendum)
Claritin, allegra, zyrtec, xyzal over the counter medications for allergies  Nasal saline then flonase   optomotrist  [facility] or Dr. Gertie Baron  Eye MD (more comprehensive eye exam) -[facility]  -Dr. Marvel Plan

## 2020-11-20 DIAGNOSIS — F339 Major depressive disorder, recurrent, unspecified: Secondary | ICD-10-CM | POA: Insufficient documentation

## 2020-11-20 HISTORY — DX: Major depressive disorder, recurrent, unspecified: F33.9

## 2020-11-21 IMAGING — US US THYROID
1 series · 14 of 25 positions shown · non-contrast
Comparison: None.

CLINICAL DATA: Other.  Abnormal thyroid function tests.

EXAM:
THYROID ULTRASOUND
TECHNIQUE: Ultrasound examination of the thyroid gland and adjacent soft
tissues was performed.

[Series 1: us thyroid · 14 of 28 slices shown]
[im 1/28]
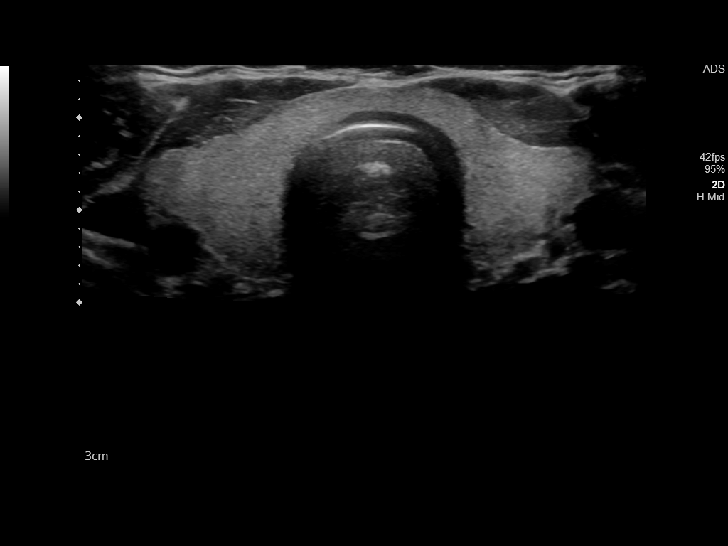
[im 3/28]
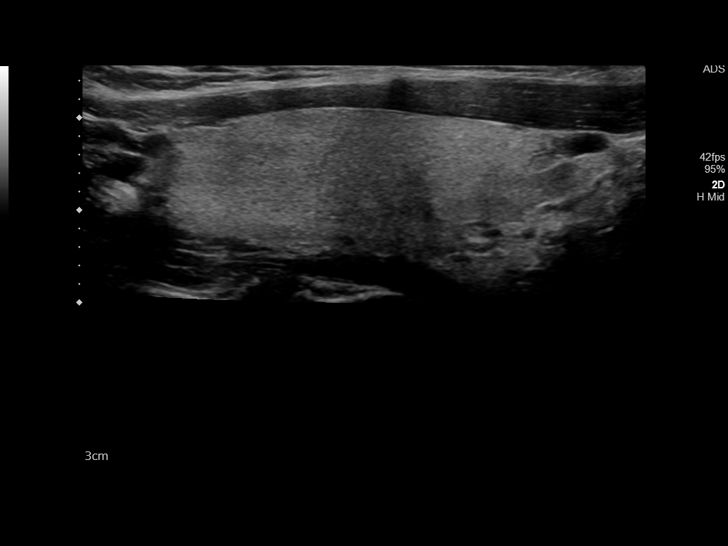
[im 5/28]
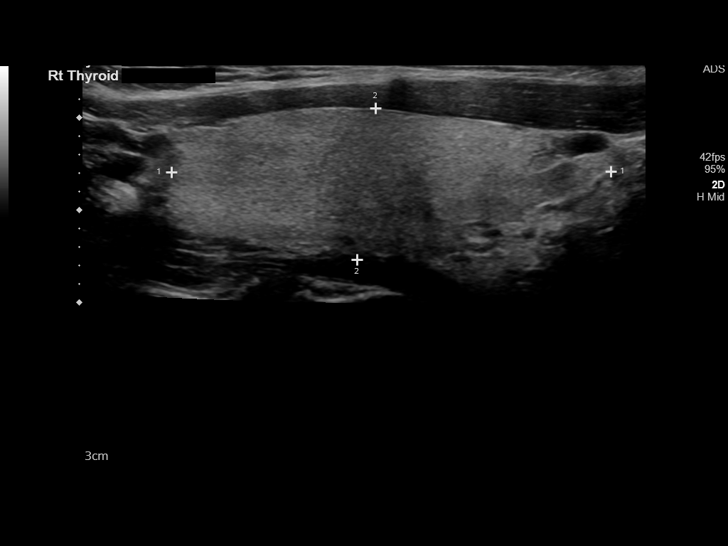
[im 7/28]
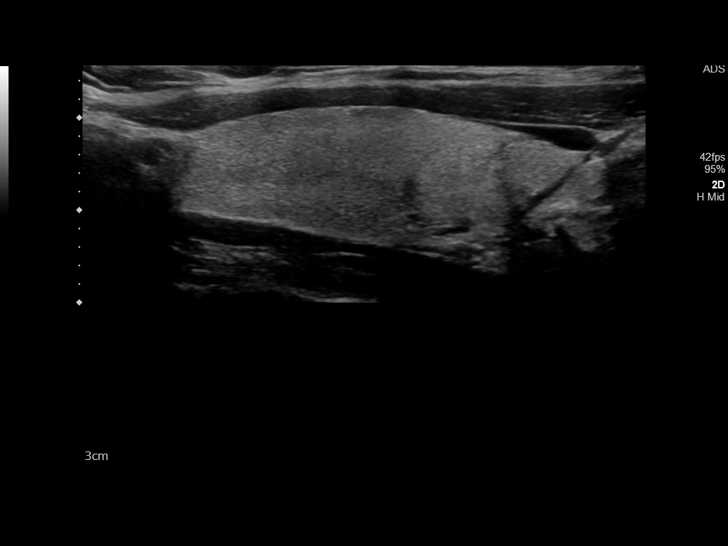
[im 10/28]
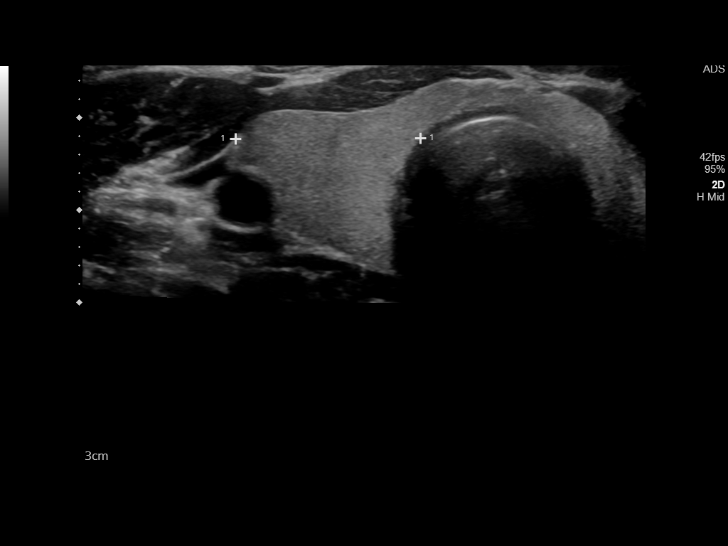
[im 11/28]
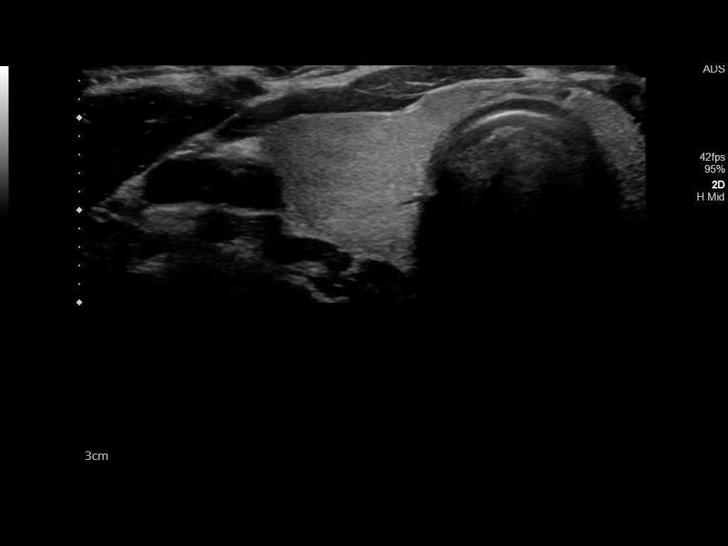
[im 13/28]
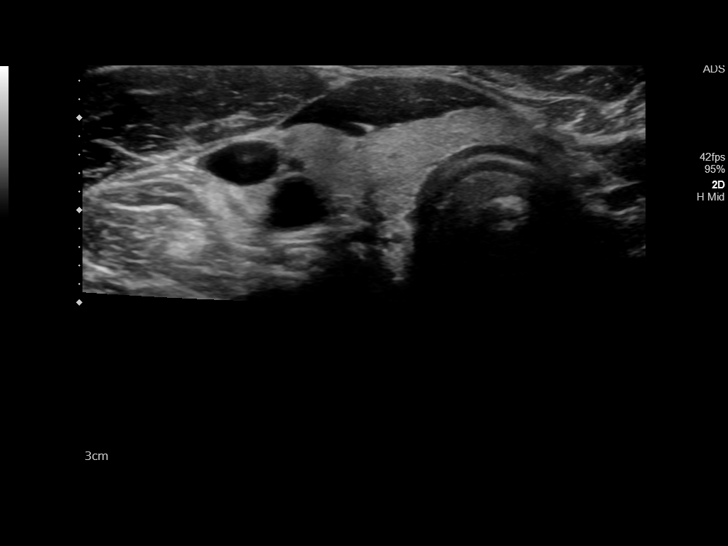
[im 15/28]
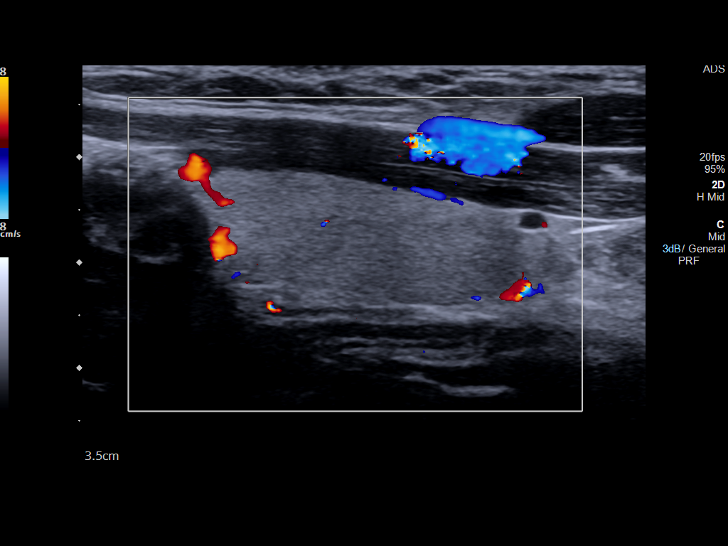
[im 17/28]
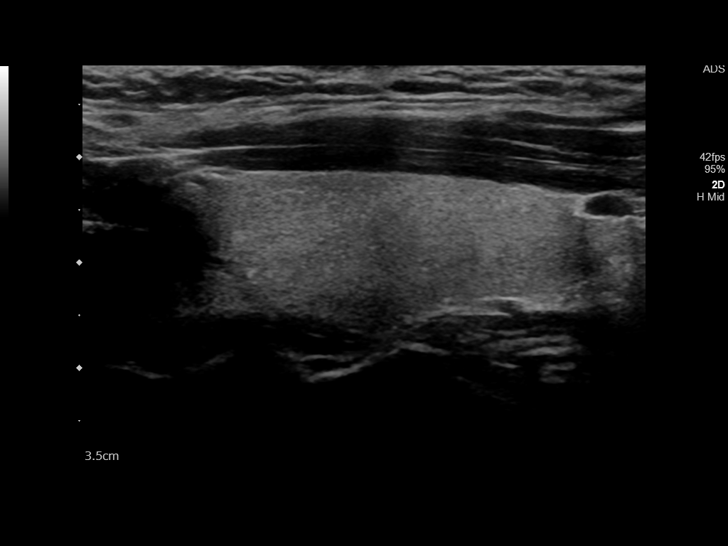
[im 19/28]
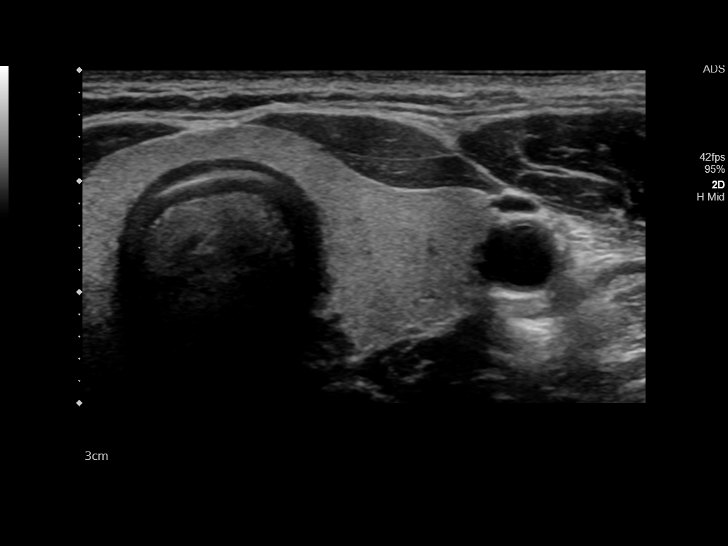
[im 21/28]
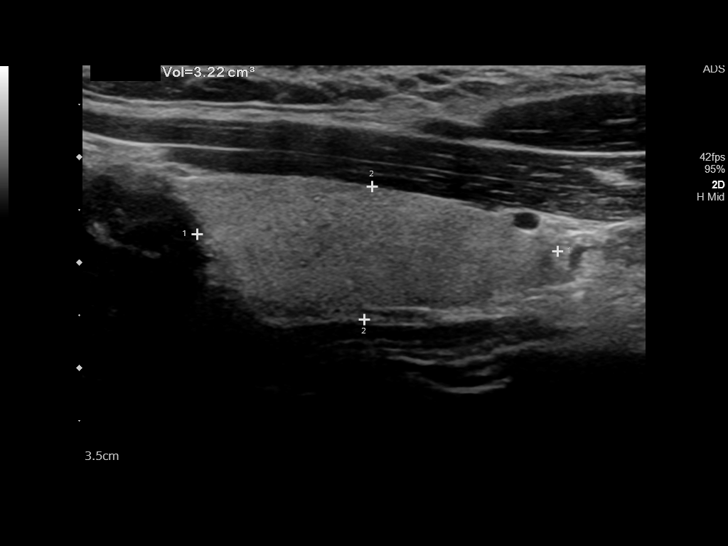
[im 23/28]
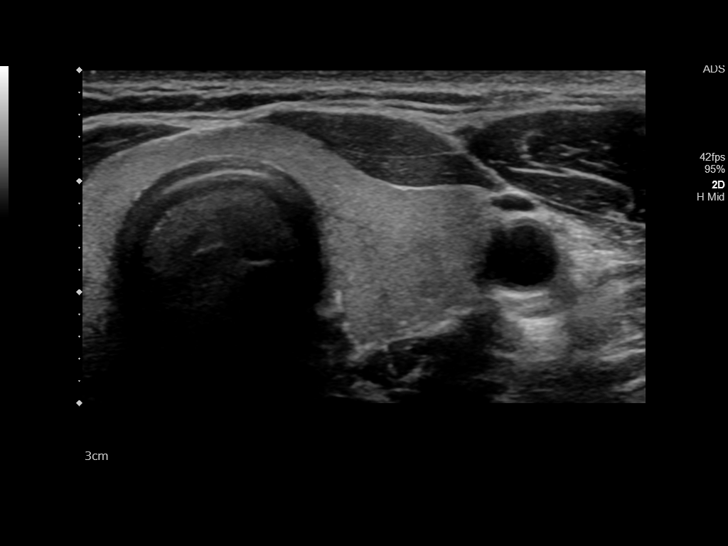
[im 25/28]
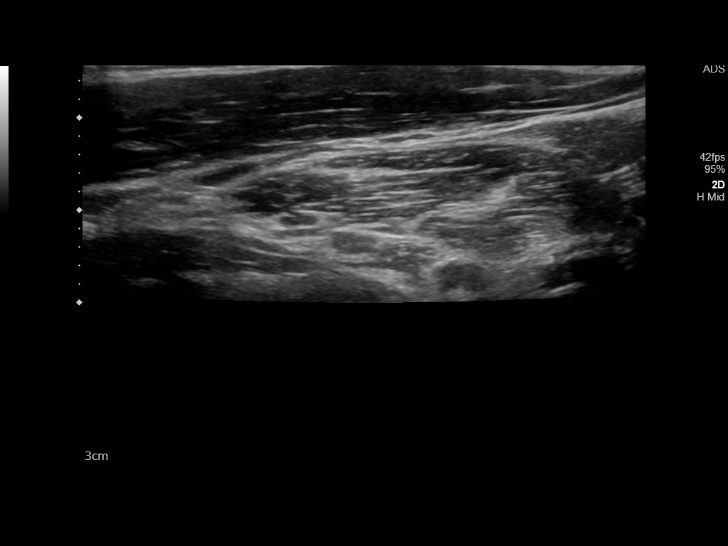
[im 28/28]
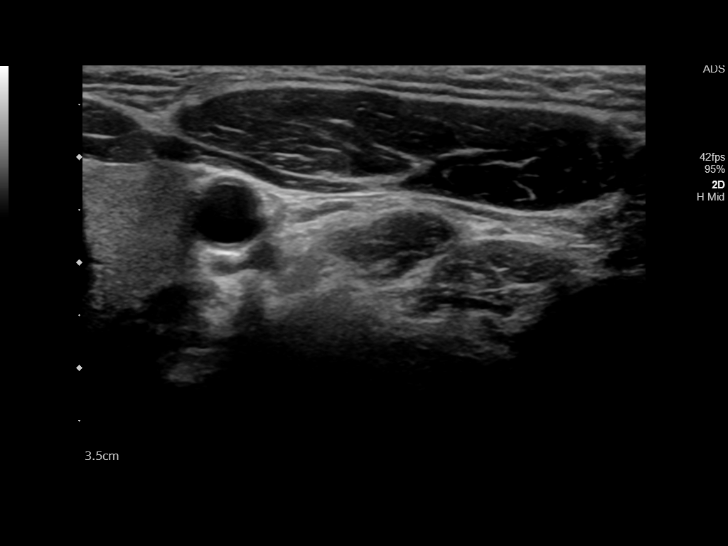

[14 of 25 positions shown; findings below may reference images not displayed]

FINDINGS: Parenchymal Echotexture: Normal

Isthmus: 0.3 cm

Right lobe: 4.8 x 1.7 x 2.0 cm

Left lobe: 3.4 x 1.3 x 1.6 cm

_________________________________________________________

Estimated total number of nodules >/= 1 cm: 0

Number of spongiform nodules >/=  2 cm not described below (TR1): 0

Number of mixed cystic and solid nodules >/= 1.5 cm not described
below (TR2): 0

_________________________________________________________

No discrete nodules are seen within the thyroid gland. No abnormal
appearing lymph nodes identified.
IMPRESSION: Normal thyroid ultrasound.

The above is in keeping with the ACR TI-RADS recommendations - [HOSPITAL] 9621;[DATE].

## 2020-12-18 ENCOUNTER — Ambulatory Visit (INDEPENDENT_AMBULATORY_CARE_PROVIDER_SITE_OTHER): Payer: BC Managed Care – PPO | Admitting: Podiatry

## 2020-12-18 ENCOUNTER — Other Ambulatory Visit: Payer: Self-pay

## 2020-12-18 ENCOUNTER — Other Ambulatory Visit: Payer: Self-pay | Admitting: Podiatry

## 2020-12-18 ENCOUNTER — Ambulatory Visit (INDEPENDENT_AMBULATORY_CARE_PROVIDER_SITE_OTHER): Payer: BC Managed Care – PPO

## 2020-12-18 ENCOUNTER — Encounter: Payer: Self-pay | Admitting: Podiatry

## 2020-12-18 DIAGNOSIS — M778 Other enthesopathies, not elsewhere classified: Secondary | ICD-10-CM | POA: Diagnosis not present

## 2020-12-18 DIAGNOSIS — M779 Enthesopathy, unspecified: Secondary | ICD-10-CM

## 2020-12-18 DIAGNOSIS — M722 Plantar fascial fibromatosis: Secondary | ICD-10-CM

## 2020-12-18 MED ORDER — METHYLPREDNISOLONE 4 MG PO TBPK: ORAL_TABLET | ORAL | 0 refills | Status: DC

## 2020-12-18 MED ORDER — TRIAMCINOLONE ACETONIDE 40 MG/ML IJ SUSP
20.0000 mg | Freq: Once | INTRAMUSCULAR | Status: AC
  Administered 2020-12-18: 20 mg

## 2020-12-18 MED ORDER — MELOXICAM 15 MG PO TABS: 15.0000 mg | ORAL_TABLET | Freq: Every day | ORAL | 3 refills | Status: DC

## 2020-12-18 NOTE — Progress Notes (Signed)
Subjective:  Patient ID: Erica Mccall, female    DOB: January 24, 1971,  MRN: [id number] HPI Chief Complaint  Patient presents with  . Foot Pain    Plantar heel/arch right - aching x few months, AM pain, tried plantar fasciitis socks and that has really helped  . New Patient (Initial Visit)    50 y.o. female presents with the above complaint.   ROS: Denies fever chills nausea vomiting muscle aches pains calf pain back pain chest pain shortness of breath.  Right foot is been hurting around the heel for more than 6 months.  Denies any trauma.  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  Has to wear safety shoes at [employer].  Past Medical History:  Diagnosis Date  . Allergy   . Anxiety   . Anxiety   . ASCUS with positive high risk HPV cervical 12/2018  . Blood in stool   . BV (bacterial vaginosis)   . Chicken pox   . Endometriosis   . Headache   . Leiomyoma of uterus   . Menorrhagia   . Seasonal allergies   . UTI (urinary tract infection)   . Vitamin D deficiency    Past Surgical History:  Procedure Laterality Date  . COLONOSCOPY    . ENDOMETRIAL BIOPSY    . ESOPHAGOGASTRODUODENOSCOPY    . HYSTEROSCOPY  2004  . LEG SURGERY  10/13/2017   s/p fall   . ORIF ANKLE FRACTURE Left 10/13/2017   Procedure: OPEN REDUCTION INTERNAL FIXATION (ORIF) ANKLE FRACTURE;  Surgeon: Erica Sheehan, MD;  Location: [facility] ORS;  Service: Orthopedics;  Laterality: Left;  . TOTAL ABDOMINAL HYSTERECTOMY  01/2005   leio, endometriosis; Dr. Ammie Mccall; has ovaries    Current Outpatient Medications:  .  meloxicam (MOBIC) 15 MG tablet, Take 1 tablet (15 mg total) by mouth daily., Disp: 30 tablet, Rfl: 3 .  methylPREDNISolone (MEDROL DOSEPAK) 4 MG TBPK tablet, 6 day dose pack - take as directed, Disp: 21 tablet, Rfl: 0 .  Cholecalciferol (VITAMIN D3) 50000 units CAPS, Take 50,000 Units every Thursday by mouth. , Disp: , Rfl: 2 .  fluticasone (FLONASE) 50 MCG/ACT nasal spray, Place  1-2 sprays into both nostrils daily., Disp: 16 mL, Rfl: 12 .  ibuprofen (ADVIL,MOTRIN) 200 MG tablet, Take 400 mg every 6 (six) hours as needed by mouth for headache., Disp: , Rfl:  .  montelukast (SINGULAIR) 10 MG tablet, Take 1 tablet (10 mg total) by mouth at bedtime., Disp: 90 tablet, Rfl: 3 .  phentermine (ADIPEX-P) 37.5 MG tablet, Take 1 tablet (37.5 mg total) by mouth daily before breakfast. Rx 2/2, Disp: 60 tablet, Rfl: 0 .  phentermine (ADIPEX-P) 37.5 MG tablet, Take 1 tablet (37.5 mg total) by mouth daily before breakfast., Disp: 60 tablet, Rfl: 0 .  Probiotic CAPS, Take 1 capsule daily by mouth., Disp: , Rfl:  .  temazepam (RESTORIL) 30 MG capsule, Take 1 capsule (30 mg total) by mouth at bedtime as needed for sleep., Disp: 30 capsule, Rfl: 2 .  venlafaxine XR (EFFEXOR XR) 37.5 MG 24 hr capsule, Take 1 capsule (37.5 mg total) by mouth daily with breakfast. D/c celexa, Disp: 90 capsule, Rfl: 3  Current Facility-Administered Medications:  .  triamcinolone acetonide (KENALOG-40) injection 20 mg, 20 mg, Other, Once, Erica Mccall, DPM  Allergies  Allergen Reactions  . Iodine Hives  . Fish Allergy     Hives even to smell of fish  .  Pollen Extract   . Valium [Diazepam]     Does not like way makes her feel    Review of Systems Objective:  There were no vitals filed for this visit.  General: Well developed, nourished, in no acute distress, alert and oriented x3   Dermatological: Skin is warm, dry and supple bilateral. Nails x 10 are well maintained; remaining integument appears unremarkable at this time. There are no open sores, no preulcerative lesions, no rash or signs of infection present.  Vascular: Dorsalis Pedis artery and Posterior Tibial artery pedal pulses are 2/4 bilateral with immedate capillary fill time. Pedal hair growth present. No varicosities and no lower extremity edema present bilateral.   Neruologic: Grossly intact via light touch bilateral. Vibratory intact via  tuning fork bilateral. Protective threshold with Semmes Wienstein monofilament intact to all pedal sites bilateral. Patellar and Achilles deep tendon reflexes 2+ bilateral. No Babinski or clonus noted bilateral.   Musculoskeletal: No gross boney pedal deformities bilateral. No pain, crepitus, or limitation noted with foot and ankle range of motion bilateral. Muscular strength 5/5 in all groups tested bilateral.  Pain on palpation medial calcaneal tubercle of the right heel. Gait: Unassisted, Nonantalgic.    Radiographs:  Radiographs taken today demonstrate an osseously mature individual no significant acute findings.  She does have a plantar distally oriented calcaneal heel spur with soft tissue increase in density at the plantar fascial calcaneal insertion site some thickening of the Achilles at the insertion site with minor spurring.  Assessment & Plan:   Assessment: Planter fasciitis right.  Plan: Discussed etiology pathology conservative surgical therapies injected the right heel today 20 mg Kenalog 5 mg Marcaine point maximal tenderness of the right heel.  Tolerated procedure well without complications.  Start her on Medrol Dosepak to be followed by meloxicam.  Placed her in a plantar fascial brace to be followed by a night splint.  Discussed appropriate shoe gear stretching exercise ice therapy and shoe gear modifications.  We will follow-up with her in 1 month.     Breionna Punt Mccall. [place], [place]

## 2020-12-18 NOTE — Patient Instructions (Signed)

## 2021-01-08 ENCOUNTER — Other Ambulatory Visit: Payer: Self-pay

## 2021-01-08 ENCOUNTER — Ambulatory Visit
Admission: RE | Admit: 2021-01-08 | Discharge: 2021-01-08 | Disposition: A | Discharge status: Home / Self Care | Payer: BC Managed Care – PPO | Source: Ambulatory Visit | Attending: Internal Medicine | Admitting: Internal Medicine

## 2021-01-08 DIAGNOSIS — Z1231 Encounter for screening mammogram for malignant neoplasm of breast: Secondary | ICD-10-CM | POA: Diagnosis not present

## 2021-01-15 NOTE — Progress Notes (Signed)
PCP:  McLean-Scocuzza, Nino Glow, MD   Chief Complaint  Patient presents with  . Gynecologic Exam    No concerns     HPI:      [name] is a 50 y.o. [id number] who LMP was No LMP recorded (lmp unknown). Patient has had a hysterectomy., presents today for her annual examination.  Her menses are absent due to TAH for leio/endometriosis. She does not have PMB. No vasomotor sx.  Sex activity: single partner, contraception - status post hysterectomy.  Last Pap:  01/15/20 Results were normal/ neg HPV DNA. 12/14/18  Results were ASCUS with pos HPV DNA; colpo with Dr. Glennon Mac 1/20 with neg bx; repeat pap this yr Hx of STDs: HPV on pap No recent BV/yeast.   Last mammogram: 01/08/21 Results were: normal--routine follow-up in 12 months There is a FH of breast cancer in her pat aunt. Pt is BRCA neg 2014, IBIS=16.8%. Pt has declined update testing in past. There is no FH of ovarian cancer. The patient does do self-breast exams.  Tobacco use: The patient denies current or previous tobacco use. Alcohol use: none No drug use.  Exercise: moderately active  She does get adequate calcium and Vitamin D in her diet.  Colonoscopy: Sched for 3/22 at [facility]; [place] [facility] in [facility] about age 59  Labs with PCP.    Past Medical History:  Diagnosis Date  . Allergy   . Anxiety   . Anxiety   . ASCUS with positive high risk HPV cervical 12/2018  . Blood in stool   . BV (bacterial vaginosis)   . Chicken pox   . Endometriosis   . Headache   . Leiomyoma of uterus   . Menorrhagia   . Seasonal allergies   . UTI (urinary tract infection)   . Vitamin D deficiency     Past Surgical History:  Procedure Laterality Date  . COLONOSCOPY    . ENDOMETRIAL BIOPSY    . ESOPHAGOGASTRODUODENOSCOPY    . HYSTEROSCOPY  2004  . LEG SURGERY  10/13/2017   s/p fall   . ORIF ANKLE FRACTURE Left 10/13/2017   Procedure: OPEN REDUCTION INTERNAL FIXATION (ORIF) ANKLE FRACTURE;  Surgeon: Lovell Sheehan, MD;  Location: [facility] ORS;   Service: Orthopedics;  Laterality: Left;  . TOTAL ABDOMINAL HYSTERECTOMY  01/2005   leio, endometriosis; Dr. Ammie Dalton; has ovaries    Family History  Problem Relation Age of Onset  . Breast cancer Paternal Aunt 23  . Diabetes Paternal Aunt   . Colon cancer Maternal Grandmother 79  . Lung cancer Maternal Grandfather   . Prostate cancer Maternal Grandfather   . Diabetes Paternal Grandmother   . Hypertension Mother   . Graves' disease Mother   . Hypertension Brother   . Thyroid disease Maternal Aunt     Social History   Socioeconomic History  . Marital status: Divorced    Spouse name: Not on file  . Number of children: Not on file  . Years of education: Not on file  . Highest education level: Not on file  Occupational History  . Not on file  Tobacco Use  . Smoking status: Never Smoker  . Smokeless tobacco: Never Used  Vaping Use  . Vaping Use: Never used  Substance and Sexual Activity  . Alcohol use: Yes    Comment: occasionally  . Drug use: No  . Sexual activity: Yes    Birth control/protection: Surgical    Comment: Hysterectomy  Other Topics Concern  .  Not on file  Social History Narrative   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    Wears seat belts    No guns    1 son in [employer] lives in [place], 1 granddaughter age 24 y.o as of 06/2018 lives in [place] with her mother      Social Determinants of Health   Financial Resource Strain: Not on file  Food Insecurity: Not on file  Transportation Needs: Not on file  Physical Activity: Not on file  Stress: Not on file  Social Connections: Not on file  Intimate Partner Violence: Not on file    Current Meds  Medication Sig  . Cholecalciferol (VITAMIN D3) 50000 units CAPS Take 50,000 Units every Thursday by mouth.   . fluticasone (FLONASE) 50 MCG/ACT nasal spray Place 1-2 sprays into both nostrils daily.  Marland Kitchen ibuprofen (ADVIL,MOTRIN) 200 MG tablet Take 400 mg every 6 (six) hours as needed by mouth for headache.   . meloxicam (MOBIC) 15 MG tablet Take 1 tablet (15 mg total) by mouth daily.  . montelukast (SINGULAIR) 10 MG tablet Take 1 tablet (10 mg total) by mouth at bedtime.  . phentermine (ADIPEX-P) 37.5 MG tablet Take 1 tablet (37.5 mg total) by mouth daily before breakfast. Rx 2/2  . phentermine (ADIPEX-P) 37.5 MG tablet Take 1 tablet (37.5 mg total) by mouth daily before breakfast.  . Probiotic CAPS Take 1 capsule daily by mouth.  . temazepam (RESTORIL) 30 MG capsule Take 1 capsule (30 mg total) by mouth at bedtime as needed for sleep.  Marland Kitchen venlafaxine XR (EFFEXOR XR) 37.5 MG 24 hr capsule Take 1 capsule (37.5 mg total) by mouth daily with breakfast. D/c celexa     ROS:  Review of Systems  Constitutional: Negative for fatigue, fever and unexpected weight change.  Respiratory: Negative for cough, shortness of breath and wheezing.   Cardiovascular: Negative for chest pain, palpitations and leg swelling.  Gastrointestinal: Negative for blood in stool, constipation, diarrhea, nausea and vomiting.  Endocrine: Negative for cold intolerance, heat intolerance and polyuria.  Genitourinary: Negative for dyspareunia, dysuria, flank pain, frequency, genital sores, hematuria, menstrual problem, pelvic pain, urgency, vaginal bleeding, vaginal discharge and vaginal pain.  Musculoskeletal: Negative for back pain, joint swelling and myalgias.  Skin: Negative for rash.  Neurological: Negative for dizziness, syncope, light-headedness, numbness and headaches.  Hematological: Negative for adenopathy.  Psychiatric/Behavioral: Positive for agitation. Negative for confusion, sleep disturbance and suicidal ideas. The patient is not nervous/anxious.      Objective: BP 120/80   Ht '5\' 5"'  (1.651 m)   Wt 225 lb (102.1 kg)   LMP  (LMP Unknown)   BMI 37.44 kg/m    Physical Exam Constitutional:      Appearance: She is well-developed.  Genitourinary:     Vulva and vagina normal.     Genitourinary Comments:  UTERUS/CX SURG REM     Right Labia: No rash, tenderness or lesions.    Left Labia: No tenderness, lesions or rash.    Vaginal cuff intact.    No vaginal discharge, erythema, tenderness or bleeding.      Right Adnexa: not tender and no mass present.    Left Adnexa: not tender and no mass present.    Cervix is absent.     Uterus is absent.  Breasts:     Right: No mass, nipple discharge, skin change or tenderness.     Left: No mass, nipple discharge, skin change or tenderness.  Neck:     Thyroid: No thyromegaly.  Cardiovascular:     Rate and Rhythm: Normal rate and regular rhythm.     Heart sounds: Normal heart sounds. No murmur heard.   Pulmonary:     Effort: Pulmonary effort is normal.     Breath sounds: Normal breath sounds.  Abdominal:     Palpations: Abdomen is soft.     Tenderness: There is no abdominal tenderness. There is no guarding.  Musculoskeletal:        General: Normal range of motion.     Cervical back: Normal range of motion.  Neurological:     General: No focal deficit present.     Mental Status: She is alert and oriented to person, place, and time.     Cranial Nerves: No cranial nerve deficit.  Skin:    General: Skin is warm and dry.  Psychiatric:        Mood and Affect: Mood normal.        Behavior: Behavior normal.        Thought Content: Thought content normal.        Judgment: Judgment normal.  Vitals and nursing note reviewed.     Assessment/Plan: Encounter for annual routine gynecological examination  Cervical cancer screening - Plan: IGP, Aptima HPV  Screening for HPV (human papillomavirus) - Plan: IGP, Aptima HPV  ASCUS with positive high risk HPV cervical - Plan: IGP, Aptima HPV; repeat pap today.  Encounter for screening mammogram for malignant neoplasm of breast--pt current on mammo  Family history of breast cancer--pt is BRCA neg; MyRisk update testing discussed and pt declines. F/u prn.   Screening for colon cancer--has appt  3/22   GYN counsel breast self exam, mammography screening, adequate intake of calcium and vitamin D, diet and exercise     F/U  Return in about 1 year (around 01/16/2022).  Javarus Dorner B. Nat Lowenthal, PA-C 01/16/2021 8:54 AM

## 2021-01-16 ENCOUNTER — Other Ambulatory Visit: Payer: Self-pay

## 2021-01-16 ENCOUNTER — Ambulatory Visit (INDEPENDENT_AMBULATORY_CARE_PROVIDER_SITE_OTHER): Payer: BC Managed Care – PPO | Admitting: Obstetrics and Gynecology

## 2021-01-16 ENCOUNTER — Encounter: Payer: Self-pay | Admitting: Obstetrics and Gynecology

## 2021-01-16 VITALS — BP 120/80 | Ht 65.0 in | Wt 225.0 lb

## 2021-01-16 DIAGNOSIS — Z1151 Encounter for screening for human papillomavirus (HPV): Secondary | ICD-10-CM | POA: Diagnosis not present

## 2021-01-16 DIAGNOSIS — R87810 Cervical high risk human papillomavirus (HPV) DNA test positive: Secondary | ICD-10-CM

## 2021-01-16 DIAGNOSIS — Z124 Encounter for screening for malignant neoplasm of cervix: Secondary | ICD-10-CM | POA: Diagnosis not present

## 2021-01-16 DIAGNOSIS — Z1211 Encounter for screening for malignant neoplasm of colon: Secondary | ICD-10-CM

## 2021-01-16 DIAGNOSIS — Z01419 Encounter for gynecological examination (general) (routine) without abnormal findings: Secondary | ICD-10-CM | POA: Diagnosis not present

## 2021-01-16 DIAGNOSIS — Z803 Family history of malignant neoplasm of breast: Secondary | ICD-10-CM

## 2021-01-16 DIAGNOSIS — R87610 Atypical squamous cells of undetermined significance on cytologic smear of cervix (ASC-US): Secondary | ICD-10-CM | POA: Diagnosis not present

## 2021-01-16 DIAGNOSIS — Z1231 Encounter for screening mammogram for malignant neoplasm of breast: Secondary | ICD-10-CM

## 2021-01-16 NOTE — Patient Instructions (Signed)
I value your feedback and you entrusting us with your care. If you get a [facility] patient survey, I would appreciate you taking the time to let us know about your experience today. Thank you! ? ? ?

## 2021-01-20 ENCOUNTER — Encounter: Payer: BC Managed Care – PPO | Admitting: Podiatry

## 2021-01-20 LAB — IGP, APTIMA HPV: HPV Aptima: NEGATIVE

## 2021-01-24 ENCOUNTER — Ambulatory Visit (AMBULATORY_SURGERY_CENTER): Payer: BC Managed Care – PPO | Admitting: *Deleted

## 2021-01-24 ENCOUNTER — Other Ambulatory Visit: Payer: Self-pay

## 2021-01-24 ENCOUNTER — Other Ambulatory Visit: Payer: Self-pay | Admitting: Internal Medicine

## 2021-01-24 VITALS — Ht 65.0 in | Wt 221.0 lb

## 2021-01-24 DIAGNOSIS — Z1211 Encounter for screening for malignant neoplasm of colon: Secondary | ICD-10-CM

## 2021-01-24 MED ORDER — PEG 3350-KCL-NA BICARB-NACL 420 G PO SOLR: 4000.0000 mL | Freq: Once | ORAL | 0 refills | Status: AC

## 2021-01-24 NOTE — Progress Notes (Signed)
Pt verified name, DOB, address and insurance during PV today. Pt mailed instruction packet to included paper to complete and mail back to [facility] with addressed and stamped envelope, Emmi video, copy of consent form to read and not return, and instructions. PV completed over the phone. Pt encouraged to call with questions or issues  MyChart instructions to pt   No egg or soy allergy known to patient  No issues with past sedation with any surgeries or procedures No intubation problems in the past  No FH of Malignant Hyperthermia No diet pills per patient No home 02 use per patient  No blood thinners per patient  Pt states  issues with constipation - not regular since birth of 1st child- uses Dulcolax PRN and it helps make stools softer but not like she would like - will do Dulcolax daily 5 days before and a Golytely Prep for pt   No A fib or A flutter  EMMI video to pt or via [place] 19 guidelines implemented in PV today with Pt and RN  Pt is fully vaccinated  for Covid   Due to the COVID-19 pandemic we are asking patients to follow certain guidelines.  Pt aware of COVID protocols and [facility] guidelines

## 2021-01-31 ENCOUNTER — Encounter: Payer: Self-pay | Admitting: Gastroenterology

## 2021-02-07 ENCOUNTER — Encounter: Payer: Self-pay | Admitting: Gastroenterology

## 2021-02-07 ENCOUNTER — Other Ambulatory Visit: Payer: Self-pay

## 2021-02-07 ENCOUNTER — Ambulatory Visit (AMBULATORY_SURGERY_CENTER): Payer: BC Managed Care – PPO | Admitting: Gastroenterology

## 2021-02-07 VITALS — BP 135/88 | HR 59 | Temp 97.5°F | Resp 20 | Ht 65.0 in | Wt 221.0 lb

## 2021-02-07 DIAGNOSIS — Z1211 Encounter for screening for malignant neoplasm of colon: Secondary | ICD-10-CM

## 2021-02-07 MED ORDER — SODIUM CHLORIDE 0.9 % IV SOLN: 500.0000 mL | Freq: Once | INTRAVENOUS | Status: DC

## 2021-02-07 NOTE — Progress Notes (Signed)
Medical history reviewed with no changes noted. VS assessed by C.W 

## 2021-02-07 NOTE — Op Note (Signed)
[facility] Patient Name: Erica Mccall Procedure Date: 02/07/2021 7:41 AM MRN: [id number] Endoscopist: Mauri Pole , MD Age: 50 Referring MD:  Date of Birth: Apr 21, 1971 Gender: Female Account #: [id number] Procedure:                Colonoscopy Indications:              Screening for colorectal malignant neoplasm Medicines:                Monitored Anesthesia Care Procedure:                Pre-Anesthesia Assessment:                           - Prior to the procedure, a History and Physical                            was performed, and patient medications and                            allergies were reviewed. The patient's tolerance of                            previous anesthesia was also reviewed. The risks                            and benefits of the procedure and the sedation                            options and risks were discussed with the patient.                            All questions were answered, and informed consent                            was obtained. Prior Anticoagulants: The patient has                            taken no previous anticoagulant or antiplatelet                            agents. ASA Grade Assessment: II - A patient with                            mild systemic disease. After reviewing the risks                            and benefits, the patient was deemed in                            satisfactory condition to undergo the procedure.                           After obtaining informed consent, the colonoscope  was passed under direct vision. Throughout the                            procedure, the patient's blood pressure, pulse, and                            oxygen saturations were monitored continuously. The                            Olympus PCF-H190DL ([id number]) Colonoscope was                            introduced through the anus and advanced to the the                            cecum,  identified by appendiceal orifice and                            ileocecal valve. The colonoscopy was performed                            without difficulty. The patient tolerated the                            procedure well. The quality of the bowel                            preparation was good. The ileocecal valve,                            appendiceal orifice, and rectum were photographed. Scope In: 8:12:06 AM Scope Out: 8:36:51 AM Scope Withdrawal Time: 0 hours 13 minutes 21 seconds  Total Procedure Duration: 0 hours 24 minutes 45 seconds  Findings:                 The perianal and digital rectal examinations were                            normal.                           A few small-mouthed diverticula were found in the                            sigmoid colon.                           Non-bleeding external and internal hemorrhoids were                            found during retroflexion. The hemorrhoids were                            small. Complications:            No immediate complications. Estimated Blood Loss:  Estimated blood loss was minimal. Impression:               - Diverticulosis in the sigmoid colon.                           - Non-bleeding external and internal hemorrhoids.                           - No specimens collected. Recommendation:           - Patient has a contact number available for                            emergencies. The signs and symptoms of potential                            delayed complications were discussed with the                            patient. Return to normal activities tomorrow.                            Written discharge instructions were provided to the                            patient.                           - Resume previous diet.                           - Continue present medications.                           - Repeat colonoscopy in 10 years for screening                            purposes. Mauri Pole, MD 02/07/2021 8:42:04 AM This report has been signed electronically.

## 2021-02-07 NOTE — Progress Notes (Signed)
PT taken to PACU. Monitors in place. VSS. Report given to RN. 

## 2021-02-07 NOTE — Patient Instructions (Signed)
Please, read all of your discharge instructions.  YOU HAD AN ENDOSCOPIC PROCEDURE TODAY AT [facility]:   Refer to the procedure report that was given to you for any specific questions about what was found during the examination.  If the procedure report does not answer your questions, please call your gastroenterologist to clarify.  If you requested that your care partner not be given the details of your procedure findings, then the procedure report has been included in a sealed envelope for you to review at your convenience later.  YOU SHOULD EXPECT: Some feelings of bloating in the abdomen. Passage of more gas than usual.  Walking can help get rid of the air that was put into your GI tract during the procedure and reduce the bloating. If you had a lower endoscopy (such as a colonoscopy or flexible sigmoidoscopy) you may notice spotting of blood in your stool or on the toilet paper. If you underwent a bowel prep for your procedure, you may not have a normal bowel movement for a few days.  Please Note:  You might notice some irritation and congestion in your nose or some drainage.  This is from the oxygen used during your procedure.  There is no need for concern and it should clear up in a day or so.  SYMPTOMS TO REPORT IMMEDIATELY:   Following lower endoscopy (colonoscopy or flexible sigmoidoscopy):  Excessive amounts of blood in the stool  Significant tenderness or worsening of abdominal pains  Swelling of the abdomen that is new, acute  Fever of 100F or higher   For urgent or emergent issues, a gastroenterologist can be reached at any hour by calling [phone]. Do not use MyChart messaging for urgent concerns.    DIET:  We do recommend a small meal at first, but then you may proceed to your regular diet.  Drink plenty of fluids but you should avoid alcoholic beverages for 24 hours.  ACTIVITY:  You should plan to take it easy for the rest of today and you should NOT  DRIVE or use heavy machinery until tomorrow (because of the sedation medicines used during the test).    FOLLOW UP: Our staff will call the number listed on your records 48-72 hours following your procedure to check on you and address any questions or concerns that you may have regarding the information given to you following your procedure. If we do not reach you, we will leave a message.  We will attempt to reach you two times.  During this call, we will ask if you have developed any symptoms of COVID 19. If you develop any symptoms (ie: fever, flu-like symptoms, shortness of breath, cough etc.) before then, please call [phone].  If you test positive for Covid 19 in the 2 weeks post procedure, please call and report this information to [place].     SIGNATURES/CONFIDENTIALITY: You and/or your care partner have signed paperwork which will be entered into your electronic medical record.  These signatures attest to the fact that that the information above on your After Visit Summary has been reviewed and is understood.  Full responsibility of the confidentiality of this discharge information lies with you and/or your care-partner.

## 2021-02-11 ENCOUNTER — Telehealth: Payer: Self-pay | Admitting: *Deleted

## 2021-02-11 NOTE — Telephone Encounter (Signed)
  Follow up Call-  Call back number 02/07/2021  Post procedure Call Back phone  # [phone]  Permission to leave phone message Yes  Some recent data might be hidden     Patient questions:  Do you have a fever, pain , or abdominal swelling? No. Pain Score  0 *  Have you tolerated food without any problems? Yes.    Have you been able to return to your normal activities? Yes.    Do you have any questions about your discharge instructions: Diet   No. Medications  No. Follow up visit  No.  Do you have questions or concerns about your Care? No.  Actions: * If pain score is 4 or above: No action needed, pain <4.  1. Have you developed a fever since your procedure? no  2.   Have you had an respiratory symptoms (SOB or cough) since your procedure? no  3.   Have you tested positive for COVID 19 since your procedure no  4.   Have you had any family members/close contacts diagnosed with the COVID 19 since your procedure?  no   If yes to any of these questions please route to Joylene John, RN and Joella Prince, RN

## 2021-02-12 ENCOUNTER — Encounter: Payer: Self-pay | Admitting: Internal Medicine

## 2021-03-03 ENCOUNTER — Other Ambulatory Visit: Payer: Self-pay

## 2021-03-03 ENCOUNTER — Ambulatory Visit (INDEPENDENT_AMBULATORY_CARE_PROVIDER_SITE_OTHER): Payer: BC Managed Care – PPO | Admitting: Podiatry

## 2021-03-11 DIAGNOSIS — M7732 Calcaneal spur, left foot: Secondary | ICD-10-CM | POA: Diagnosis not present

## 2021-03-11 DIAGNOSIS — M79672 Pain in left foot: Secondary | ICD-10-CM | POA: Diagnosis not present

## 2021-03-11 DIAGNOSIS — M7662 Achilles tendinitis, left leg: Secondary | ICD-10-CM | POA: Diagnosis not present

## 2021-03-16 ENCOUNTER — Telehealth: Payer: Self-pay | Admitting: Internal Medicine

## 2021-03-16 MED ORDER — WEGOVY 0.5 MG/0.5ML ~~LOC~~ SOAJ: 0.5000 mg | SUBCUTANEOUS | 0 refills | Status: DC

## 2021-03-16 MED ORDER — WEGOVY 1.7 MG/0.75ML ~~LOC~~ SOAJ: 1.7000 mg | SUBCUTANEOUS | 0 refills | Status: DC

## 2021-03-16 MED ORDER — WEGOVY 1 MG/0.5ML ~~LOC~~ SOAJ: 1.0000 mg | SUBCUTANEOUS | 0 refills | Status: DC

## 2021-03-16 MED ORDER — WEGOVY 2.4 MG/0.75ML ~~LOC~~ SOAJ
2.4000 mg | SUBCUTANEOUS | 2 refills | Status: DC
Start: 2021-03-16 — End: 2021-10-21

## 2021-03-16 MED ORDER — WEGOVY 0.25 MG/0.5ML ~~LOC~~ SOAJ
0.2500 mg | SUBCUTANEOUS | 0 refills | Status: DC
Start: 2021-03-16 — End: 2021-10-21

## 2021-03-16 MED ORDER — PEN NEEDLES 30G X 8 MM MISC: 1.0000 | 3 refills | Status: DC

## 2021-03-16 NOTE — Telephone Encounter (Signed)
Pt wants to try wegovy  adipex making her jittery not feeling well  Sent to pharmacy  If needs PA this is why cant tolerate adipex above and pt will need nurse visit for teaching once approved if approve   Also sch f/u in 3 months if approved in office   Thank you

## 2021-03-16 NOTE — Addendum Note (Signed)
Addended by: Orland Mustard on: 03/16/2021 11:15 PM   Modules accepted: Orders

## 2021-03-17 ENCOUNTER — Telehealth: Payer: Self-pay | Admitting: Internal Medicine

## 2021-03-17 NOTE — Telephone Encounter (Signed)
Prior authorization has been submitted for patient's Wegovy doses   Awaiting approval or denial.

## 2021-03-17 NOTE — Telephone Encounter (Signed)
Prior authorization has been submitted for patient's Wegovy 0.25   Awaiting approval or denial.

## 2021-03-20 NOTE — Telephone Encounter (Signed)
Prior authorization has been denied.

## 2021-03-24 NOTE — Telephone Encounter (Signed)
Does she want to see the wt loss clinic in [place]?

## 2021-03-24 NOTE — Telephone Encounter (Signed)
Spoke with pt and she stated that she would like to check with her insurance and see what that will cost her before the referral has been placed.

## 2021-04-04 NOTE — Telephone Encounter (Signed)
Prior authorization has been submitted for patient's Erica Mccall 0.5mg    Awaiting approval or denial.

## 2021-04-20 ENCOUNTER — Encounter: Payer: Self-pay | Admitting: Internal Medicine

## 2021-04-29 ENCOUNTER — Other Ambulatory Visit: Payer: Self-pay | Admitting: Internal Medicine

## 2021-04-29 NOTE — Telephone Encounter (Signed)
RX Refill:restoril Last Seen:11-19-20 Last ordered:01-24-21

## 2021-08-27 ENCOUNTER — Encounter: Payer: Self-pay | Admitting: Internal Medicine

## 2021-10-21 ENCOUNTER — Ambulatory Visit (INDEPENDENT_AMBULATORY_CARE_PROVIDER_SITE_OTHER): Payer: BC Managed Care – PPO | Admitting: Obstetrics and Gynecology

## 2021-10-21 ENCOUNTER — Encounter: Payer: Self-pay | Admitting: Obstetrics and Gynecology

## 2021-10-21 ENCOUNTER — Other Ambulatory Visit: Payer: Self-pay

## 2021-10-21 VITALS — BP 124/80 | Ht 65.0 in | Wt 235.0 lb

## 2021-10-21 DIAGNOSIS — B9689 Other specified bacterial agents as the cause of diseases classified elsewhere: Secondary | ICD-10-CM | POA: Diagnosis not present

## 2021-10-21 DIAGNOSIS — N760 Acute vaginitis: Secondary | ICD-10-CM | POA: Diagnosis not present

## 2021-10-21 DIAGNOSIS — N898 Other specified noninflammatory disorders of vagina: Secondary | ICD-10-CM

## 2021-10-21 LAB — POCT WET PREP WITH KOH
Clue Cells Wet Prep HPF POC: POSITIVE
KOH Prep POC: POSITIVE — AB
Trichomonas, UA: NEGATIVE
Yeast Wet Prep HPF POC: NEGATIVE

## 2021-10-21 MED ORDER — METRONIDAZOLE 0.75 % VA GEL: 1.0000 | Freq: Every day | VAGINAL | 0 refills | Status: AC

## 2021-10-21 NOTE — Progress Notes (Signed)
Mccall, Erica Glow, MD   Chief Complaint  Patient presents with   Vaginal Itching    X 1 week, no discharge, odor    HPI:      [name] is a 50 y.o. [id number] whose LMP was No LMP recorded (lmp unknown). Patient has had a hysterectomy., presents today for external vag itching for the past wk, no d/c or odor. Is treating with ext hydrocortisone crm with sx relief. Uses dove soap, plans to change to sensitive skin; no new detergents, shower gels, etc. No urin sx, no LBP, pelvic pain, fevers. Hx of BV in past.  She is sex active, no pain/bleeding  Patient Active Problem List   Diagnosis Date Noted   Depression, recurrent ([place]) 11/20/2020   PTSD (post-traumatic stress disorder) 07/16/2020   Stress 07/16/2020   Annual physical exam 01/16/2020   Abnormal thyroid function test 06/13/2018   Obesity (BMI 35.0-39.9 without comorbidity) 05/10/2018   Anxiety and depression 02/07/2018   Insomnia 02/07/2018   Leg edema, left 02/07/2018   Family history of breast cancer 12/13/2017   Allergic rhinitis 07/10/2016   Endometriosis 07/10/2016    Past Surgical History:  Procedure Laterality Date   COLONOSCOPY     ~18 yrs ago    ENDOMETRIAL BIOPSY     ESOPHAGOGASTRODUODENOSCOPY     HYSTEROSCOPY  2004   LEG SURGERY  10/13/2017   s/p fall    ORIF ANKLE FRACTURE Left 10/13/2017   Procedure: OPEN REDUCTION INTERNAL FIXATION (ORIF) ANKLE FRACTURE;  Surgeon: Lovell Sheehan, MD;  Location: [facility] OR[facility];  Service: Orthopedics;  Laterality: Left;   TOTAL ABDOMINAL HYSTERECTOMY  01/2005   leio, endometriosis; Dr. Ammie Dalton; has ovaries   UPPER GASTROINTESTINAL ENDOSCOPY      Family History  Problem Relation Age of Onset   Breast cancer Paternal Aunt 17   Diabetes Paternal Aunt    Colon cancer Maternal Grandmother 50   Lung cancer Maternal Grandfather    Prostate cancer Maternal Grandfather    Diabetes Paternal Grandmother    Hypertension Mother    Erica Mccall' disease Mother     Hypertension Brother    Thyroid disease Maternal Aunt    Colon polyps Neg Hx    Esophageal cancer Neg Hx    Rectal cancer Neg Hx    Stomach cancer Neg Hx     Social History   Socioeconomic History   Marital status: Divorced    Spouse name: Not on file   Number of children: Not on file   Years of education: Not on file   Highest education level: Not on file  Occupational History   Not on file  Tobacco Use   Smoking status: Never   Smokeless tobacco: Never  Vaping Use   Vaping Use: Never used  Substance and Sexual Activity   Alcohol use: Yes    Comment: occasionally   Drug use: No   Sexual activity: Yes    Birth control/protection: Surgical    Comment: Hysterectomy  Other Topics Concern   Not on file  Social History Narrative   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    Wears seat belts    No guns    1 son in [place] lives in [place], 1 granddaughter age 24 y.o as of 06/2018 lives in [place] with her mother      Social Determinants of Health   Financial Resource Strain: Not on file  Food Insecurity: Not  on file  Transportation Needs: Not on file  Physical Activity: Not on file  Stress: Not on file  Social Connections: Not on file  Intimate Partner Violence: Not on file    Outpatient Medications Prior to Visit  Medication Sig Dispense Refill   BIOTIN PO Take by mouth.     Cholecalciferol (VITAMIN D3) 50000 units CAPS Take 50,000 Units every Thursday by mouth.   2   fluticasone (FLONASE) 50 MCG/ACT nasal spray Place 1-2 sprays into both nostrils daily. 16 mL 12   ibuprofen (ADVIL,MOTRIN) 200 MG tablet Take 400 mg every 6 (six) hours as needed by mouth for headache.     montelukast (SINGULAIR) 10 MG tablet Take 1 tablet (10 mg total) by mouth at bedtime. 90 tablet 3   Probiotic CAPS Take 1 capsule daily by mouth.     temazepam (RESTORIL) 30 MG capsule TAKE 1 CAPSULE(30 MG) BY MOUTH AT BEDTIME AS NEEDED FOR SLEEP 30 capsule 5   venlafaxine XR (EFFEXOR XR) 37.5  MG 24 hr capsule Take 1 capsule (37.5 mg total) by mouth daily with breakfast. D/c celexa 90 capsule 3   Insulin Pen Needle (PEN NEEDLES) 30G X 8 MM MISC 1 Device by Does not apply route once a week. 12 each 3   meloxicam (MOBIC) 15 MG tablet Take 1 tablet (15 mg total) by mouth daily. (Patient not taking: No sig reported) 30 tablet 3   Semaglutide-Weight Management (WEGOVY) 0.25 MG/0.5ML SOAJ Inject 0.25 mg into the skin once a week. 1x weekly week 1-4. D/c phentermine 2 mL 0   Semaglutide-Weight Management (WEGOVY) 0.5 MG/0.5ML SOAJ Inject 0.5 mg into the skin once a week. Week 5-8 4 mL 0   Semaglutide-Weight Management (WEGOVY) 1 MG/0.5ML SOAJ Inject 1 mg into the skin once a week. Week 9 to week 12 6 mL 0   Semaglutide-Weight Management (WEGOVY) 1.7 MG/0.75ML SOAJ Inject 1.7 mg into the skin once a week. Week 13 to week 16 9 mL 0   Semaglutide-Weight Management (WEGOVY) 2.4 MG/0.75ML SOAJ Inject 2.4 mg into the skin once a week. Week 17 and after 9 mL 2   No facility-administered medications prior to visit.      ROS:  Review of Systems  Constitutional:  Negative for fever.  Gastrointestinal:  Negative for blood in stool, constipation, diarrhea, nausea and vomiting.  Genitourinary:  Negative for dyspareunia, dysuria, flank pain, frequency, hematuria, urgency, vaginal bleeding, vaginal discharge and vaginal pain.  Musculoskeletal:  Negative for back pain.  Skin:  Negative for rash.  BREAST: No symptoms   OBJECTIVE:   Vitals:  BP 124/80   Ht 5\' 5"  (1.651 m)   Wt 235 lb (106.6 kg)   LMP  (LMP Unknown)   BMI 39.11 kg/m   Physical Exam Vitals reviewed.  Constitutional:      Appearance: She is well-developed.  Pulmonary:     Effort: Pulmonary effort is normal.  Genitourinary:    General: Normal vulva.     Pubic Area: No rash.      Labia:        Right: No rash, tenderness or lesion.        Left: No rash, tenderness or lesion.      Vagina: Vaginal discharge present. No  erythema or tenderness.     Cervix: Normal.     Uterus: Normal. Not enlarged and not tender.      Adnexa: Right adnexa normal and left adnexa normal.       Right:  No mass or tenderness.         Left: No mass or tenderness.       Comments: NEG EXT EXAM; ITCHING AT BILAB LABIA MAJORA WITH NEG EXAM Musculoskeletal:        General: Normal range of motion.     Cervical back: Normal range of motion.  Skin:    General: Skin is warm and dry.  Neurological:     General: No focal deficit present.     Mental Status: She is alert and oriented to person, place, and time.  Psychiatric:        Mood and Affect: Mood normal.        Behavior: Behavior normal.        Thought Content: Thought content normal.        Judgment: Judgment normal.    Results: Results for orders placed or performed in visit on 10/21/21 (from the past 24 hour(s))  POCT Wet Prep with KOH     Status: Abnormal   Collection Time: 10/21/21 11:19 AM  Result Value Ref Range   Trichomonas, UA Negative    Clue Cells Wet Prep HPF POC pos    Epithelial Wet Prep HPF POC     Yeast Wet Prep HPF POC neg    Bacteria Wet Prep HPF POC     RBC Wet Prep HPF POC     WBC Wet Prep HPF POC     KOH Prep POC Positive (A) Negative     Assessment/Plan: BV (bacterial vaginosis) - Plan: metroNIDAZOLE (METROGEL) 0.75 % vaginal gel, POCT Wet Prep with KOH; pos exam/wet prep. Rx metrogel. F/u prn.   Vaginal itching - Plan: POCT Wet Prep with KOH; neg exam. Most likely not related to BV since sx ext. Most likely chem derm. Change to dove sens skin soap, cont ext hydrocortisone crm. F/u if sx persist.    Meds ordered this encounter  Medications   metroNIDAZOLE (METROGEL) 0.75 % vaginal gel    Sig: Place 1 Applicatorful vaginally at bedtime for 5 days.    Dispense:  50 g    Refill:  0    Order Specific Question:   Supervising Provider    Answer:   Gae Dry [[id number]]      Return if symptoms worsen or fail to improve.  Pamelia Botto B.  Copper Basnett, PA-C 10/21/2021 11:21 AM

## 2021-10-21 NOTE — Patient Instructions (Signed)
I value your feedback and you entrusting us with your care. If you get a [facility] patient survey, I would appreciate you taking the time to let us know about your experience today. Thank you! ? ? ?

## 2021-10-28 ENCOUNTER — Other Ambulatory Visit: Payer: Self-pay | Admitting: Internal Medicine

## 2021-10-29 NOTE — Telephone Encounter (Signed)
Refilled: 04/29/2021 Last OV: 11/19/2020 Next OV: 11/20/2021

## 2021-11-04 ENCOUNTER — Telehealth: Payer: Self-pay | Admitting: Internal Medicine

## 2021-11-04 NOTE — Telephone Encounter (Signed)
Labs are in from 11/2020, extended these so they would not expire.   Called and scheduled Patient for labs 11/14/21.

## 2021-11-04 NOTE — Telephone Encounter (Signed)
Patient called and would like to do labs before her 11/20/2021 appt. No orders in chart

## 2021-11-04 NOTE — Addendum Note (Signed)
Addended by: Thressa Sheller on: 11/04/2021 10:11 AM   Modules accepted: Orders

## 2021-11-11 ENCOUNTER — Encounter: Payer: Self-pay | Admitting: Obstetrics and Gynecology

## 2021-11-14 ENCOUNTER — Other Ambulatory Visit (INDEPENDENT_AMBULATORY_CARE_PROVIDER_SITE_OTHER): Payer: BC Managed Care – PPO

## 2021-11-14 ENCOUNTER — Other Ambulatory Visit: Payer: Self-pay

## 2021-11-14 DIAGNOSIS — Z0000 Encounter for general adult medical examination without abnormal findings: Secondary | ICD-10-CM

## 2021-11-14 DIAGNOSIS — Z13220 Encounter for screening for lipoid disorders: Secondary | ICD-10-CM

## 2021-11-14 DIAGNOSIS — Z1389 Encounter for screening for other disorder: Secondary | ICD-10-CM

## 2021-11-14 DIAGNOSIS — Z1329 Encounter for screening for other suspected endocrine disorder: Secondary | ICD-10-CM

## 2021-11-14 LAB — LIPID PANEL
Cholesterol: 172 mg/dL (ref 0–200)
HDL: 43.2 mg/dL (ref >39.00)
LDL Cholesterol: 107 mg/dL — ABNORMAL HIGH (ref 0–99)
NonHDL: 129.17
Total CHOL/HDL Ratio: 4
Triglycerides: 110 mg/dL (ref 0.0–149.0)
VLDL: 22 mg/dL (ref 0.0–40.0)

## 2021-11-14 LAB — URINALYSIS, ROUTINE W REFLEX MICROSCOPIC
Bilirubin Urine: NEGATIVE
Ketones, ur: NEGATIVE
Nitrite: NEGATIVE
Specific Gravity, Urine: 1.01 (ref 1.000–1.030)
Total Protein, Urine: NEGATIVE
Urine Glucose: NEGATIVE
Urobilinogen, UA: 0.2 (ref 0.0–1.0)
pH: 6.5 (ref 5.0–8.0)

## 2021-11-14 LAB — CBC WITH DIFFERENTIAL/PLATELET
Basophils Absolute: 0 10*3/uL (ref 0.0–0.1)
Basophils Relative: 0.5 % (ref 0.0–3.0)
Eosinophils Absolute: 0.4 10*3/uL (ref 0.0–0.7)
Eosinophils Relative: 10.2 % — ABNORMAL HIGH (ref 0.0–5.0)
HCT: 38.8 % (ref 36.0–46.0)
Hemoglobin: 12.8 g/dL (ref 12.0–15.0)
Lymphocytes Relative: 47.3 % — ABNORMAL HIGH (ref 12.0–46.0)
Lymphs Abs: 2 10*3/uL (ref 0.7–4.0)
MCHC: 33.1 g/dL (ref 30.0–36.0)
MCV: 93.7 fl (ref 78.0–100.0)
Monocytes Absolute: 0.4 10*3/uL (ref 0.1–1.0)
Monocytes Relative: 8.4 % (ref 3.0–12.0)
Neutro Abs: 1.4 10*3/uL (ref 1.4–7.7)
Neutrophils Relative %: 33.6 % — ABNORMAL LOW (ref 43.0–77.0)
Platelets: 255 10*3/uL (ref 150.0–400.0)
RBC: 4.14 Mil/uL (ref 3.87–5.11)
RDW: 12.8 % (ref 11.5–15.5)
WBC: 4.2 10*3/uL (ref 4.0–10.5)

## 2021-11-14 LAB — COMPREHENSIVE METABOLIC PANEL
ALT: 12 U/L (ref 0–35)
AST: 14 U/L (ref 0–37)
Albumin: 3.7 g/dL (ref 3.5–5.2)
Alkaline Phosphatase: 77 U/L (ref 39–117)
BUN: 15 mg/dL (ref 6–23)
CO2: 25 mEq/L (ref 19–32)
Calcium: 9.1 mg/dL (ref 8.4–10.5)
Chloride: 103 mEq/L (ref 96–112)
Creatinine, Ser: 0.81 mg/dL (ref 0.40–1.20)
GFR: 84.7 mL/min (ref >60.00)
Glucose, Bld: 79 mg/dL (ref 70–99)
Potassium: 3.8 mEq/L (ref 3.5–5.1)
Sodium: 137 mEq/L (ref 135–145)
Total Bilirubin: 0.4 mg/dL (ref 0.2–1.2)
Total Protein: 7.2 g/dL (ref 6.0–8.3)

## 2021-11-14 LAB — TSH: TSH: 1.62 u[IU]/mL (ref 0.35–5.50)

## 2021-11-20 ENCOUNTER — Other Ambulatory Visit: Payer: Self-pay

## 2021-11-20 ENCOUNTER — Encounter: Payer: Self-pay | Admitting: Internal Medicine

## 2021-11-20 ENCOUNTER — Ambulatory Visit (INDEPENDENT_AMBULATORY_CARE_PROVIDER_SITE_OTHER): Payer: BC Managed Care – PPO | Admitting: Internal Medicine

## 2021-11-20 VITALS — BP 126/88 | HR 86 | Temp 97.0°F | Ht 65.0 in | Wt 238.6 lb

## 2021-11-20 DIAGNOSIS — E669 Obesity, unspecified: Secondary | ICD-10-CM

## 2021-11-20 DIAGNOSIS — R319 Hematuria, unspecified: Secondary | ICD-10-CM | POA: Diagnosis not present

## 2021-11-20 DIAGNOSIS — Z0000 Encounter for general adult medical examination without abnormal findings: Secondary | ICD-10-CM | POA: Diagnosis not present

## 2021-11-20 DIAGNOSIS — R030 Elevated blood-pressure reading, without diagnosis of hypertension: Secondary | ICD-10-CM | POA: Diagnosis not present

## 2021-11-20 DIAGNOSIS — N939 Abnormal uterine and vaginal bleeding, unspecified: Secondary | ICD-10-CM

## 2021-11-20 DIAGNOSIS — Z23 Encounter for immunization: Secondary | ICD-10-CM | POA: Diagnosis not present

## 2021-11-20 DIAGNOSIS — Z1231 Encounter for screening mammogram for malignant neoplasm of breast: Secondary | ICD-10-CM

## 2021-11-20 MED ORDER — PHENTERMINE HCL 37.5 MG PO TABS
37.5000 mg | ORAL_TABLET | Freq: Every day | ORAL | 0 refills | Status: DC
Start: 2021-11-20 — End: 2021-12-18

## 2021-11-20 MED ORDER — PHENTERMINE HCL 37.5 MG PO TABS
37.5000 mg | ORAL_TABLET | Freq: Every day | ORAL | 0 refills | Status: DC
Start: 2021-11-20 — End: 2022-06-24

## 2021-11-20 MED ORDER — CITALOPRAM HYDROBROMIDE 40 MG PO TABS: 20.0000 mg | ORAL_TABLET | Freq: Every day | ORAL | 3 refills | Status: DC

## 2021-11-20 NOTE — Progress Notes (Signed)
Chief Complaint  Patient presents with   Annual Exam   Annual  1. Elevated BP today slightly  2. Chronic insomnia on restoril 30 mg qhs prn anxiety, h/o depression wants to change effexor 37.5 back to celexa 40 mg qd  3. Obesity and hld rec healthy diet and exercise and wants to get back on adipex  4. Hematuria and blood on tissue paper when wiping w/in the last 1 week and blood in urine no h/o kidney stones s/p partial hysterectomy endometriosis still has cervix and ovaries    Review of Systems  Constitutional:  Negative for weight loss.  HENT:  Negative for hearing loss.   Eyes:  Negative for blurred vision.  Respiratory:  Negative for shortness of breath.   Cardiovascular:  Negative for chest pain.  Gastrointestinal:  Negative for abdominal pain and blood in stool.  Genitourinary:  Negative for dysuria.  Musculoskeletal:  Negative for falls and joint pain.  Skin:  Negative for rash.  Neurological:  Negative for headaches.  Psychiatric/Behavioral:  Negative for depression.   Past Medical History:  Diagnosis Date   Allergy    Anxiety    Anxiety    ASCUS with positive high risk HPV cervical 12/2018   Blood in stool    BV (bacterial vaginosis)    Chicken pox    Endometriosis    Headache    Insomnia    Leiomyoma of uterus    Menorrhagia    Seasonal allergies    UTI (urinary tract infection)    Vitamin D deficiency    Past Surgical History:  Procedure Laterality Date   COLONOSCOPY     ~18 yrs ago    ENDOMETRIAL BIOPSY     ESOPHAGOGASTRODUODENOSCOPY     HYSTEROSCOPY  2004   LEG SURGERY  10/13/2017   s/p fall    ORIF ANKLE FRACTURE Left 10/13/2017   Procedure: OPEN REDUCTION INTERNAL FIXATION (ORIF) ANKLE FRACTURE;  Surgeon: Lovell Sheehan, MD;  Location: [facility] OR[facility];  Service: Orthopedics;  Laterality: Left;   TOTAL ABDOMINAL HYSTERECTOMY  01/2005   leio, endometriosis; Dr. Ammie Dalton; has ovaries and she thinks cervix   UPPER GASTROINTESTINAL ENDOSCOPY     Family  History  Problem Relation Age of Onset   Breast cancer Paternal Aunt 50   Diabetes Paternal Aunt    Colon cancer Maternal Grandmother 50   Lung cancer Maternal Grandfather    Prostate cancer Maternal Grandfather    Diabetes Paternal Grandmother    Hypertension Mother    Berenice Primas' disease Mother    Hypertension Brother    Thyroid disease Maternal Aunt    Colon polyps Neg Hx    Esophageal cancer Neg Hx    Rectal cancer Neg Hx    Stomach cancer Neg Hx    Social History   Socioeconomic History   Marital status: Divorced    Spouse name: Not on file   Number of children: Not on file   Years of education: Not on file   Highest education level: Not on file  Occupational History   Not on file  Tobacco Use   Smoking status: Never   Smokeless tobacco: Never  Vaping Use   Vaping Use: Never used  Substance and Sexual Activity   Alcohol use: Yes    Comment: occasionally   Drug use: No   Sexual activity: Yes    Birth control/protection: Surgical    Comment: Hysterectomy  Other Topics Concern   Not on file  Social History 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    citalopram (CELEXA) 40 MG tablet Take 0.5-1 tablets (20-40 mg total) by mouth daily. 1/2 pill x 2 weeks then 1 pill daily D/c effexor 37.5   fluticasone (FLONASE) 50 MCG/ACT nasal spray Place 1-2 sprays into both nostrils daily.   ibuprofen (ADVIL,MOTRIN) 200 MG tablet Take  400 mg every 6 (six) hours as needed by mouth for headache.   montelukast (SINGULAIR) 10 MG tablet Take 1 tablet (10 mg total) by mouth at bedtime.   phentermine (ADIPEX-P) 37.5 MG tablet Take 1 tablet (37.5 mg total) by mouth daily before breakfast. Rx 1/2   phentermine (ADIPEX-P) 37.5 MG tablet Take 1 tablet (37.5 mg total) by mouth daily before breakfast. 2/2   Probiotic CAPS Take 1 capsule daily by mouth.   temazepam (RESTORIL) 30 MG capsule TAKE 1 CAPSULE(30 MG) BY MOUTH AT BEDTIME AS NEEDED FOR SLEEP   [DISCONTINUED] venlafaxine XR (EFFEXOR XR) 37.5 MG 24 hr capsule Take 1 capsule (37.5 mg total) by mouth daily with breakfast. D/c celexa   Allergies  Allergen Reactions   Iodine Hives   Adipex-P [Phentermine]     jittery   Fish Allergy     Hives even to smell of fish   Pollen Extract    Valium [Diazepam]     Does not like way makes her feel    Recent Results (from the past 2160 hour(s))  POCT Wet Prep with KOH     Status: Abnormal   Collection Time: 10/21/21 11:19 AM  Result Value Ref Range   Trichomonas, UA Negative    Clue Cells Wet Prep HPF POC pos    Epithelial Wet Prep HPF POC     Yeast Wet Prep HPF POC neg    Bacteria Wet Prep HPF POC     RBC Wet Prep HPF POC     WBC Wet Prep HPF POC     KOH Prep POC Positive (A) Negative  Comprehensive metabolic panel     Status: None   Collection Time: 11/14/21 11:27 AM  Result Value Ref Range   Sodium 137 135 - 145 mEq/L   Potassium 3.8 3.5 - 5.1 mEq/L   Chloride 103 96 - 112 mEq/L   CO2 25 19 - 32 mEq/L   Glucose, Bld 79 70 - 99 mg/dL   BUN 15 6 - 23 mg/dL   Creatinine, Ser 0.81 0.40 - 1.20 mg/dL   Total Bilirubin 0.4 0.2 - 1.2 mg/dL   Alkaline Phosphatase 77 39 - 117 U/L   AST 14 0 - 37 U/L   ALT 12 0 - 35 U/L   Total Protein 7.2 6.0 - 8.3 g/dL   Albumin 3.7 3.5 - 5.2 g/dL   GFR 84.70 >60.00 mL/min    Comment: Calculated using the CKD-EPI Creatinine  Equation (2021)   Calcium 9.1 8.4 - 10.5 mg/dL  Lipid panel      Status: Abnormal   Collection Time: 11/14/21 11:27 AM  Result Value Ref Range   Cholesterol 172 0 - 200 mg/dL    Comment: ATP III Classification       Desirable:  < 200 mg/dL               Borderline High:  200 - 239 mg/dL          High:  > = 240 mg/dL   Triglycerides 110.0 0.0 - 149.0 mg/dL    Comment: Normal:  <150 mg/dLBorderline High:  150 - 199 mg/dL   HDL 43.20 >39.00 mg/dL   VLDL 22.0 0.0 - 40.0 mg/dL   LDL Cholesterol 107 (H) 0 - 99 mg/dL   Total CHOL/HDL Ratio 4     Comment:                Men          Women1/2 Average Risk     3.4          3.3Average Risk          5.0          4.42X Average Risk          9.6          7.13X Average Risk          15.0          11.0                       NonHDL 129.17     Comment: NOTE:  Non-HDL goal should be 30 mg/dL higher than patient's LDL goal (i.e. LDL goal of < 70 mg/dL, would have non-HDL goal of < 100 mg/dL)  TSH     Status: None   Collection Time: 11/14/21 11:27 AM  Result Value Ref Range   TSH 1.62 0.35 - 5.50 uIU/mL  Urinalysis, Routine w reflex microscopic     Status: Abnormal   Collection Time: 11/14/21 11:27 AM  Result Value Ref Range   Color, Urine YELLOW Yellow;Lt. Yellow;Straw;Dark Yellow;Amber;Green;Red;Brown   APPearance CLEAR Clear;Turbid;Slightly Cloudy;Cloudy   Specific Gravity, Urine 1.010 1.000 - 1.030   pH 6.5 5.0 - 8.0   Total Protein, Urine NEGATIVE Negative   Urine Glucose NEGATIVE Negative   Ketones, ur NEGATIVE Negative   Bilirubin Urine NEGATIVE Negative   Hgb urine dipstick TRACE-INTACT (A) Negative   Urobilinogen, UA 0.2 0.0 - 1.0   Leukocytes,Ua SMALL (A) Negative   Nitrite NEGATIVE Negative   WBC, UA 3-6/hpf (A) 0-2/hpf   RBC / HPF 0-2/hpf 0-2/hpf   Squamous Epithelial / LPF Few(5-10/hpf) (A) Rare(0-4/hpf)   Bacteria, UA Few(10-50/hpf) (A) None  CBC w/Diff     Status: Abnormal   Collection Time: 11/14/21 11:27 AM  Result Value Ref Range   WBC 4.2 4.0 - 10.5 K/uL   RBC 4.14 3.87 - 5.11 Mil/uL    Hemoglobin 12.8 12.0 - 15.0 g/dL   HCT 38.8 36.0 - 46.0 %   MCV 93.7 78.0 - 100.0 fl   MCHC 33.1 30.0 - 36.0 g/dL   RDW 12.8 11.5 - 15.5 %   Platelets 255.0 150.0 - 400.0 K/uL   Neutrophils Relative % 33.6 (L) 43.0 - 77.0 %   Lymphocytes Relative 47.3 (H) 12.0 - 46.0 %   Monocytes Relative 8.4 3.0 - 12.0 %   Eosinophils Relative  10.2 (H) 0.0 - 5.0 %   Basophils Relative 0.5 0.0 - 3.0 %   Neutro Abs 1.4 1.4 - 7.7 K/uL   Lymphs Abs 2.0 0.7 - 4.0 K/uL   Monocytes Absolute 0.4 0.1 - 1.0 K/uL   Eosinophils Absolute 0.4 0.0 - 0.7 K/uL   Basophils Absolute 0.0 0.0 - 0.1 K/uL   Objective  Body mass index is 39.71 kg/m. Wt Readings from Last 3 Encounters:  11/20/21 238 lb 9.6 oz (108.2 kg)  10/21/21 235 lb (106.6 kg)  02/07/21 221 lb (100.2 kg)   Temp Readings from Last 3 Encounters:  11/20/21 (!) 97 F (36.1 C) (Temporal)  02/07/21 (!) 97.5 F (36.4 C) (Skin)  11/19/20 98.2 F (36.8 C) (Oral)   BP Readings from Last 3 Encounters:  11/20/21 126/88  10/21/21 124/80  02/07/21 135/88   Pulse Readings from Last 3 Encounters:  11/20/21 86  02/07/21 (!) 59  11/19/20 78    Physical Exam Vitals and nursing note reviewed.  Constitutional:      Appearance: Normal appearance. She is well-developed and well-groomed.  HENT:     Head: Normocephalic and atraumatic.  Eyes:     Conjunctiva/sclera: Conjunctivae normal.     Pupils: Pupils are equal, round, and reactive to light.  Cardiovascular:     Rate and Rhythm: Normal rate and regular rhythm.     Heart sounds: Normal heart sounds. No murmur heard. Pulmonary:     Effort: Pulmonary effort is normal.     Breath sounds: Normal breath sounds.  Abdominal:     General: Abdomen is flat. Bowel sounds are normal.     Tenderness: There is no abdominal tenderness.  Musculoskeletal:        General: No tenderness.  Skin:    General: Skin is warm and dry.  Neurological:     General: No focal deficit present.     Mental Status: She is  alert and oriented to person, place, and time. Mental status is at baseline.     Cranial Nerves: Cranial nerves 2-12 are intact.     Gait: Gait is intact.  Psychiatric:        Attention and Perception: Attention and perception normal.        Mood and Affect: Mood and affect normal.        Speech: Speech normal.        Behavior: Behavior normal. Behavior is cooperative.        Thought Content: Thought content normal.        Cognition and Memory: Cognition and memory normal.        Judgment: Judgment normal.    Assessment  Plan  Annual physical exam Needs flu shot - Plan: Flu Vaccine QUAD 6+ mos PF IM (Fluarix Quad PF) See below    Obesity (BMI 30-39.9) - Plan: phentermine (ADIPEX-P) 37.5 MG tablet, phentermine (ADIPEX-P) 37.5 MG tablet  Hematuria +vaginal bleeding  r/o GU etiology I.e kidney stones, female organ etiology - Plan: CT ABDOMEN PELVIS WO CONTRAST, Urine Culture  Elevated blood pressure reading Monitor BP rec goal <130/<80   HM Flu shot given today  Tdap had 2015/2016 per pt  Consider shingrix   Declines hep B titer  MMR immune moderna 3/3 consider 4th dose    Pap [facility] 12/2018 ascus f/u had 01/15/20 negative neg HPV S/p partial hysterectomy for endometriosis still with ovaries and cervix [facility]    mammogram  01/08/21 negative ordered    Colonoscopy age 17-49 screening 02/07/21  repeat 10 years   rec exercise and healthy diet choices    Provider: Dr. Olivia Mackie McLean-Scocuzza-Internal Medicine

## 2021-11-20 NOTE — Patient Instructions (Addendum)
[employer] omron blood pressure cuff   Consider shingrix vaccine  X 2 doses 2nd shot before 6 months   Consider moderna shot    Hematuria, Adult Hematuria is blood in the urine. Blood may be visible in the urine, or it may be identified with a test. This condition can be caused by infections of the bladder, urethra, kidney, or prostate. Other possible causes include: Kidney stones. Cancer of the urinary tract. Too much calcium in the urine. Conditions that are passed from parent to child (inherited conditions). Exercise that requires a lot of energy. Infections can usually be treated with medicine, and a kidney stone usually will pass through your urine. If neither of these is the cause of your hematuria, more tests may be needed to identify the cause of your symptoms. It is very important to tell your health care provider about any blood in your urine, even if it is painless or the blood stops without treatment. Blood in the urine, when it happens and then stops and then happens again, can be a symptom of a very serious condition, including cancer. There is no pain in the initial stages of many urinary cancers. Follow these instructions at home: Medicines Take over-the-counter and prescription medicines only as told by your health care provider. If you were prescribed an antibiotic medicine, take it as told by your health care provider. Do not stop taking the antibiotic even if you start to feel better. Eating and drinking Drink enough fluid to keep your urine pale yellow. It is recommended that you drink 3-4 quarts (2.8-3.8 L) a day. If you have been diagnosed with an infection, drinking cranberry juice in addition to large amounts of water is recommended. Avoid caffeine, tea, and carbonated beverages. These tend to irritate the bladder. Avoid alcohol because it may irritate the prostate (in males). General instructions If you have been diagnosed with a kidney stone, follow your health care  provider's instructions about straining your urine to catch the stone. Empty your bladder often. Avoid holding urine for long periods of time. If you are female: After a bowel movement, wipe from front to back and use each piece of toilet paper only once. Empty your bladder before and after sex. Pay attention to any changes in your symptoms. Tell your health care provider about any changes or any new symptoms. It is up to you to get the results of any tests. Ask your health care provider, or the department that is doing the test, when your results will be ready. Keep all follow-up visits. This is important. Contact a health care provider if: You develop back pain. You have a fever or chills. You have nausea or vomiting. Your symptoms do not improve after 3 days. Your symptoms get worse. Get help right away if: You develop severe vomiting and are unable to take medicine without vomiting. You develop severe pain in your back or abdomen even though you are taking medicine. You pass a large amount of blood in your urine. You pass blood clots in your urine. You feel very weak or like you might faint. You faint. Summary Hematuria is blood in the urine. It has many possible causes. It is very important that you tell your health care provider about any blood in your urine, even if it is painless or the blood stops without treatment. Take over-the-counter and prescription medicines only as told by your health care provider. Drink enough fluid to keep your urine pale yellow. This information is not intended  to replace advice given to you by your health care provider. Make sure you discuss any questions you have with your health care provider. Document Revised: 07/24/2020 Document Reviewed: 07/24/2020 Elsevier Patient Education  [place].  Zoster Vaccine, Recombinant injection What is this medication? ZOSTER VACCINE (ZOS ter vak SEEN) is a vaccine used to reduce the risk of getting  shingles. This vaccine is not used to treat shingles or nerve pain from shingles. This medicine may be used for other purposes; ask your health care provider or pharmacist if you have questions. COMMON BRAND NAME(S): [facility] What should I tell my care team before I take this medication? They need to know if you have any of these conditions: cancer immune system problems an unusual or allergic reaction to Zoster vaccine, other medications, foods, dyes, or preservatives pregnant or trying to get pregnant breast-feeding How should I use this medication? This vaccine is injected into a muscle. It is given by a health care provider. A copy of Vaccine Information Statements will be given before each vaccination. Be sure to read this information carefully each time. This sheet may change often. Talk to your health care provider about the use of this vaccine in children. This vaccine is not approved for use in children. Overdosage: If you think you have taken too much of this medicine contact a poison control center or emergency room at once. NOTE: This medicine is only for you. Do not share this medicine with others. What if I miss a dose? Keep appointments for follow-up (booster) doses. It is important not to miss your dose. Call your health care provider if you are unable to keep an appointment. What may interact with this medication? medicines that suppress your immune system medicines to treat cancer steroid medicines like prednisone or cortisone This list may not describe all possible interactions. Give your health care provider a list of all the medicines, herbs, non-prescription drugs, or dietary supplements you use. Also tell them if you smoke, drink alcohol, or use illegal drugs. Some items may interact with your medicine. What should I watch for while using this medication? Visit your health care provider regularly. This vaccine, like all vaccines, may not fully protect everyone. What  side effects may I notice from receiving this medication? Side effects that you should report to your doctor or health care professional as soon as possible: allergic reactions (skin rash, itching or hives; swelling of the face, lips, or tongue) trouble breathing Side effects that usually do not require medical attention (report these to your doctor or health care professional if they continue or are bothersome): chills headache fever nausea pain, redness, or irritation at site where injected tiredness vomiting This list may not describe all possible side effects. Call your doctor for medical advice about side effects. You may report side effects to FDA at 1-800-FDA-1088. Where should I keep my medication? This vaccine is only given by a health care provider. It will not be stored at home. NOTE: This sheet is a summary. It may not cover all possible information. If you have questions about this medicine, talk to your doctor, pharmacist, or health care provider.  2022 Elsevier/Gold Standard (2021-08-12 00:00:00)  Cholesterol Content in Foods Cholesterol is a waxy, fat-like substance that helps to carry fat in the blood. The body needs cholesterol in small amounts, but too much cholesterol can cause damage to the arteries and heart. What foods have cholesterol? Cholesterol is found in animal-based foods, such as meat, seafood, and  dairy. Generally, low-fat dairy and lean meats have less cholesterol than full-fat dairy and fatty meats. The milligrams of cholesterol per serving (mg per serving) of common cholesterol-containing foods are listed below. Meats and other proteins Egg -- one large whole egg has 186 mg. Veal shank -- 4 oz (113 g) has 141 mg. Lean ground [place] (93% lean) -- 4 oz (113 g) has 118 mg. Fat-trimmed lamb loin -- 4 oz (113 g) has 106 mg. Lean ground beef (90% lean) -- 4 oz (113 g) has 100 mg. Lobster -- 3.5 oz (99 g) has 90 mg. Pork loin chops -- 4 oz (113 g) has 86  mg. Canned salmon -- 3.5 oz (99 g) has 83 mg. Fat-trimmed beef top loin -- 4 oz (113 g) has 78 mg. Frankfurter -- 1 frank (3.5 oz or 99 g) has 77 mg. Crab -- 3.5 oz (99 g) has 71 mg. Roasted chicken without skin, white meat -- 4 oz (113 g) has 66 mg. Light bologna -- 2 oz (57 g) has 45 mg. Deli-cut [place] -- 2 oz (57 g) has 31 mg. Canned tuna -- 3.5 oz (99 g) has 31 mg. Berniece Salines -- 1 oz (28 g) has 29 mg. Oysters and mussels (raw) -- 3.5 oz (99 g) has 25 mg. Mackerel -- 1 oz (28 g) has 22 mg. Trout -- 1 oz (28 g) has 20 mg. Pork sausage -- 1 link (1 oz or 28 g) has 17 mg. Salmon -- 1 oz (28 g) has 16 mg. Tilapia -- 1 oz (28 g) has 14 mg. Dairy Soft-serve ice cream --  cup (4 oz or 86 g) has 103 mg. Whole-milk yogurt -- 1 cup (8 oz or 245 g) has 29 mg. Cheddar cheese -- 1 oz (28 g) has 28 mg. American cheese -- 1 oz (28 g) has 28 mg. Whole milk -- 1 cup (8 oz or 250 mL) has 23 mg. 2% milk -- 1 cup (8 oz or 250 mL) has 18 mg. Cream cheese -- 1 tablespoon (Tbsp) (14.5 g) has 15 mg. Cottage cheese --  cup (4 oz or 113 g) has 14 mg. Low-fat (1%) milk -- 1 cup (8 oz or 250 mL) has 10 mg. Sour cream -- 1 Tbsp (12 g) has 8.5 mg. Low-fat yogurt -- 1 cup (8 oz or 245 g) has 8 mg. Nonfat Greek yogurt -- 1 cup (8 oz or 228 g) has 7 mg. Half-and-half cream -- 1 Tbsp (15 mL) has 5 mg. Fats and oils Cod liver oil -- 1 tablespoon (Tbsp) (13.6 g) has 82 mg. Butter -- 1 Tbsp (14 g) has 15 mg. Lard -- 1 Tbsp (12.8 g) has 14 mg. Bacon grease -- 1 Tbsp (12.9 g) has 14 mg. Mayonnaise -- 1 Tbsp (13.8 g) has 5-10 mg. Margarine -- 1 Tbsp (14 g) has 3-10 mg. The items listed above may not be a complete list of foods with cholesterol. Exact amounts of cholesterol in these foods may vary depending on specific ingredients and brands. Contact a dietitian for more information. What foods do not have cholesterol? Most plant-based foods do not have cholesterol unless you combine them with a food that has  cholesterol. Foods without cholesterol include: Grains and cereals. Vegetables. Fruits. Vegetable oils, such as olive, canola, and sunflower oil. Legumes, such as peas, beans, and lentils. Nuts and seeds. Egg whites. The items listed above may not be a complete list of foods that do not have cholesterol. Contact a dietitian for  more information. Summary The body needs cholesterol in small amounts, but too much cholesterol can cause damage to the arteries and heart. Cholesterol is found in animal-based foods, such as meat, seafood, and dairy. Generally, low-fat dairy and lean meats have less cholesterol than full-fat dairy and fatty meats. This information is not intended to replace advice given to you by your health care provider. Make sure you discuss any questions you have with your health care provider. Document Revised: 04/04/2021 Document Reviewed: 04/04/2021 Elsevier Patient Education  [place].  High Cholesterol High cholesterol is a condition in which the blood has high levels of a white, waxy substance similar to fat (cholesterol). The liver makes all the cholesterol that the body needs. The human body needs small amounts of cholesterol to help build cells. A person gets extra or excess cholesterol from the food that he or she eats. The blood carries cholesterol from the liver to the rest of the body. If you have high cholesterol, deposits (plaques) may build up on the walls of your arteries. Arteries are the blood vessels that carry blood away from your heart. These plaques make the arteries narrow and stiff. Cholesterol plaques increase your risk for heart attack and stroke. Work with your health care provider to keep your cholesterol levels in a healthy range. What increases the risk? The following factors may make you more likely to develop this condition: Eating foods that are high in animal fat (saturated fat) or cholesterol. Being overweight. Not getting enough  exercise. A family history of high cholesterol (familial hypercholesterolemia). Use of tobacco products. Having diabetes. What are the signs or symptoms? In most cases, high cholesterol does not usually cause any symptoms. In severe cases, very high cholesterol levels can cause: Fatty bumps under the skin (xanthomas). A white or gray ring around the black center (pupil) of the eye. How is this diagnosed? This condition may be diagnosed based on the results of a blood test. If you are older than 50 years of age, your health care provider may check your cholesterol levels every 4-6 years. You may be checked more often if you have high cholesterol or other risk factors for heart disease. The blood test for cholesterol measures: "Bad" cholesterol, or LDL cholesterol. This is the main type of cholesterol that causes heart disease. The desired level is less than 100 mg/dL (2.59 mmol/L). "Good" cholesterol, or HDL cholesterol. HDL helps protect against heart disease by cleaning the arteries and carrying the LDL to the liver for processing. The desired level for HDL is 60 mg/dL (1.55 mmol/L) or higher. Triglycerides. These are fats that your body can store or burn for energy. The desired level is less than 150 mg/dL (1.69 mmol/L). Total cholesterol. This measures the total amount of cholesterol in your blood and includes LDL, HDL, and triglycerides. The desired level is less than 200 mg/dL (5.17 mmol/L). How is this treated? Treatment for high cholesterol starts with lifestyle changes, such as diet and exercise. Diet changes. You may be asked to eat foods that have more fiber and less saturated fats or added sugar. Lifestyle changes. These may include regular exercise, maintaining a healthy weight, and quitting use of tobacco products. Medicines. These are given when diet and lifestyle changes have not worked. You may be prescribed a statin medicine to help lower your cholesterol levels. Follow these  instructions at home: Eating and drinking  Eat a healthy, balanced diet. This diet includes: Daily servings of a variety of fresh, frozen,  or canned fruits and vegetables. Daily servings of whole grain foods that are rich in fiber. Foods that are low in saturated fats and trans fats. These include poultry and fish without skin, lean cuts of meat, and low-fat dairy products. A variety of fish, especially oily fish that contain omega-3 fatty acids. Aim to eat fish at least 2 times a week. Avoid foods and drinks that have added sugar. Use healthy cooking methods, such as roasting, grilling, broiling, baking, poaching, steaming, and stir-frying. Do not fry your food except for stir-frying. If you drink alcohol: Limit how much you have to: 0-1 drink a day for women who are not pregnant. 0-2 drinks a day for men. Know how much alcohol is in a drink. In the [place] one drink equals one 12 oz bottle of beer (355 mL), one 5 oz glass of wine (148 mL), or one 1 oz glass of hard liquor (44 mL). Lifestyle  Get regular exercise. Aim to exercise for a total of 150 minutes a week. Increase your activity level by doing activities such as gardening, walking, and taking the stairs. Do not use any products that contain nicotine or tobacco. These products include cigarettes, chewing tobacco, and vaping devices, such as e-cigarettes. If you need help quitting, ask your health care provider. General instructions Take over-the-counter and prescription medicines only as told by your health care provider. Keep all follow-up visits. This is important. Where to find more information American Heart Association: www.heart.org National Heart, Lung, and Blood Institute: https://wilson-eaton.com/ Contact a health care provider if: You have trouble achieving or maintaining a healthy diet or weight. You are starting an exercise program. You are unable to stop smoking. Get help right away if: You have chest pain. You have trouble  breathing. You have discomfort or pain in your jaw, neck, back, shoulder, or arm. You have any symptoms of a stroke. "BE FAST" is an easy way to remember the main warning signs of a stroke: B - Balance. Signs are dizziness, sudden trouble walking, or loss of balance. E - Eyes. Signs are trouble seeing or a sudden change in vision. F - Face. Signs are sudden weakness or numbness of the face, or the face or eyelid drooping on one side. A - Arms. Signs are weakness or numbness in an arm. This happens suddenly and usually on one side of the body. S - Speech. Signs are sudden trouble speaking, slurred speech, or trouble understanding what people say. T - Time. Time to call emergency services. Write down what time symptoms started. You have other signs of a stroke, such as: A sudden, severe headache with no known cause. Nausea or vomiting. Seizure. These symptoms may represent a serious problem that is an emergency. Do not wait to see if the symptoms will go away. Get medical help right away. Call your local emergency services (911 in the [place] Do not drive yourself to the hospital. Summary Cholesterol plaques increase your risk for heart attack and stroke. Work with your health care provider to keep your cholesterol levels in a healthy range. Eat a healthy, balanced diet, get regular exercise, and maintain a healthy weight. Do not use any products that contain nicotine or tobacco. These products include cigarettes, chewing tobacco, and vaping devices, such as e-cigarettes. Get help right away if you have any symptoms of a stroke. This information is not intended to replace advice given to you by your health care provider. Make sure you discuss any questions you have with your  health care provider. Document Revised: 02/06/2021 Document Reviewed: 01/27/2021 Elsevier Patient Education  2022 [employer].

## 2021-11-24 ENCOUNTER — Telehealth: Payer: Self-pay | Admitting: Internal Medicine

## 2021-11-24 NOTE — Telephone Encounter (Signed)
Error

## 2021-11-25 ENCOUNTER — Telehealth: Payer: Self-pay | Admitting: Internal Medicine

## 2021-11-25 ENCOUNTER — Ambulatory Visit: Payer: BC Managed Care – PPO

## 2021-11-25 NOTE — Telephone Encounter (Signed)
Erica Mccall called in [place] , Need order  for the exam schedule for tomorrow, Please call Erica Mccall @  [phone]

## 2021-11-26 DIAGNOSIS — R3129 Other microscopic hematuria: Secondary | ICD-10-CM | POA: Diagnosis not present

## 2021-11-26 NOTE — Telephone Encounter (Signed)
I dont know what this is in regards to  [place] can you call

## 2021-11-26 NOTE — Telephone Encounter (Signed)
RECEIVED a call back this morning. They state that Patient is having her CT abdomen pelvis done here instead of [facility] outpatient.   Needing requisition faxed. This has been sent to [phone]

## 2021-11-26 NOTE — Telephone Encounter (Signed)
Please advise, CT abdomen pelvis has been ordered.   Do you know what order they are needing?

## 2021-11-27 ENCOUNTER — Telehealth: Payer: Self-pay | Admitting: Internal Medicine

## 2021-11-27 DIAGNOSIS — R319 Hematuria, unspecified: Secondary | ICD-10-CM

## 2021-11-27 NOTE — Telephone Encounter (Signed)
Ct scan ab/pelvis normal no cause for blood in urine We need to collect repeat ua and urine culture Please order UA and culture call and schedule pt

## 2021-12-02 NOTE — Addendum Note (Signed)
Addended by: Thressa Sheller on: 12/02/2021 03:48 PM   Modules accepted: Orders

## 2021-12-02 NOTE — Telephone Encounter (Signed)
Patient informed and verbalized understanding.  Scheduled for lab appointment 12/29  Orders placed

## 2021-12-04 ENCOUNTER — Other Ambulatory Visit: Payer: Self-pay | Admitting: Internal Medicine

## 2021-12-04 ENCOUNTER — Telehealth (INDEPENDENT_AMBULATORY_CARE_PROVIDER_SITE_OTHER): Payer: BC Managed Care – PPO | Admitting: Family Medicine

## 2021-12-04 ENCOUNTER — Encounter: Payer: Self-pay | Admitting: Family Medicine

## 2021-12-04 ENCOUNTER — Other Ambulatory Visit: Payer: BC Managed Care – PPO

## 2021-12-04 VITALS — Temp 99.3°F

## 2021-12-04 DIAGNOSIS — U071 COVID-19: Secondary | ICD-10-CM

## 2021-12-04 DIAGNOSIS — F419 Anxiety disorder, unspecified: Secondary | ICD-10-CM

## 2021-12-04 DIAGNOSIS — F339 Major depressive disorder, recurrent, unspecified: Secondary | ICD-10-CM

## 2021-12-04 DIAGNOSIS — F4310 Post-traumatic stress disorder, unspecified: Secondary | ICD-10-CM

## 2021-12-04 MED ORDER — BENZONATATE 100 MG PO CAPS
100.0000 mg | ORAL_CAPSULE | Freq: Three times a day (TID) | ORAL | 0 refills | Status: DC | PRN
Start: 2021-12-04 — End: 2021-12-18

## 2021-12-04 MED ORDER — NIRMATRELVIR/RITONAVIR (PAXLOVID)TABLET: 3.0000 | ORAL_TABLET | Freq: Two times a day (BID) | ORAL | 0 refills | Status: AC

## 2021-12-04 NOTE — Patient Instructions (Addendum)
I am sorry to hear that you are sick.  I am glad that you had the COVID-vaccine and booster as that should hopefully lessen the severity of your illness. Make sure to drink plenty of fluids, rest. mucinex for cough.  Tessalon if needed to suppress cough or cough at night. Vitamin C and zinc supplement may help.  If any chest pain, shortness of breath at rest or confusion be seen at urgent care or emergency room.   I did prescribe the antiviral medication Paxlovid to your pharmacy.  You can decide whether or not you want to take that medicine in the next day or 2 as we have up to 5 days to start an antiviral.  See information below from the [facility] regarding isolation and masking precautions.  Today would be day 1 of illness.  Hope you feel better soon and please reach out to [place] if there are questions.   Your employer may have specific requirements for return to work, but this information is provided from the CDC. There is a link provided below for more information if needed.   Everyone who has presumed or confirmed COVID-19 should stay home and isolate from other people for at least 5 full days (day 0 is the first day of symptoms or the date of the day of the positive viral test for asymptomatic persons). You can end isolation after 5 full days if you are fever-free for 24 hours without the use of fever-reducing medication and your other symptoms have improved (Loss of taste and smell may persist for weeks or months after recovery and need not delay the end of isolation). You should continue to wear a well-fitting mask around others at home and in public for 5 additional days (day 6 through day 10) after the end of your 5-day isolation period. If you are unable to wear a mask when around others, you should continue to isolate for a full 10 days. Avoid people who have weakened immune systems or are more likely to get very sick from COVID-19, and nursing homes and other high-risk settings, until after at least  10 days.  If you continue to have fever or your other symptoms have not improved after 5 days of isolation, you should wait to end your isolation until you are fever-free for 24 hours without the use of fever-reducing medication and your other symptoms have improved. Continue to wear a well-fitting mask through day 10.   [email or web address].org/.html

## 2021-12-04 NOTE — Progress Notes (Signed)
Virtual Visit via Video Note  I connected with [name] on 12/04/21 at 5:20 PM by a video enabled telemedicine application and verified that I am speaking with the correct person using two identifiers.  Patient location: home - by self My location: office - [place]   I discussed the limitations, risks, security and privacy concerns of performing an evaluation and management service by telephone and the availability of in person appointments. I also discussed with the patient that there may be a patient responsible charge related to this service. The patient expressed understanding and agreed to proceed, consent obtained  Chief complaint:  Chief Complaint  Patient presents with   Covid Positive    Pt reports sore throat, fever, cough, fatigue, body aches, cough, pt reports sxs started yesterday, COVID positive Wednesday      History of Present Illness: Erica Mccall is a 50 y.o. female  COVID-19 infection Initial symptoms started yesterday with positive home test for COVID yesterday. Sore throat, fever, cough, fatigue, body aches, headache, chills.  Father with covid infection recently - with him few days ago.  No chest pain or dyspnea. No confusion. Drinking fluids ok - water, gatorade, oranges. Sleeping ok.   Tx: theraflu.   COVID risk of complication score of 1. COVID vaccination in March and April 2021, booster in December 2021.   eGFR 84 on 11/14/2021  Patient Active Problem List   Diagnosis Date Noted   Depression, recurrent ([place]) 11/20/2020   PTSD (post-traumatic stress disorder) 07/16/2020   Stress 07/16/2020   Annual physical exam 01/16/2020   Abnormal thyroid function test 06/13/2018   Obesity (BMI 35.0-39.9 without comorbidity) 05/10/2018   Anxiety and depression 02/07/2018   Insomnia 02/07/2018   Leg edema, left 02/07/2018   Family history of breast cancer 12/13/2017   Allergic rhinitis 07/10/2016   Endometriosis 07/10/2016   Past Medical History:   Diagnosis Date   Allergy    Anxiety    Anxiety    ASCUS with positive high risk HPV cervical 12/2018   Blood in stool    BV (bacterial vaginosis)    Chicken pox    Endometriosis    Headache    Insomnia    Leiomyoma of uterus    Menorrhagia    Seasonal allergies    UTI (urinary tract infection)    Vitamin D deficiency    Past Surgical History:  Procedure Laterality Date   COLONOSCOPY     ~18 yrs ago    ENDOMETRIAL BIOPSY     ESOPHAGOGASTRODUODENOSCOPY     HYSTEROSCOPY  2004   LEG SURGERY  10/13/2017   s/p fall    ORIF ANKLE FRACTURE Left 10/13/2017   Procedure: OPEN REDUCTION INTERNAL FIXATION (ORIF) ANKLE FRACTURE;  Surgeon: Lovell Sheehan, MD;  Location: [facility] ORS;  Service: Orthopedics;  Laterality: Left;   TOTAL ABDOMINAL HYSTERECTOMY  01/2005   leio, endometriosis; Dr. Ammie Dalton; has ovaries and she thinks cervix   UPPER GASTROINTESTINAL ENDOSCOPY     Allergies  Allergen Reactions   Iodine Hives   Adipex-P [Phentermine]     jittery   Fish Allergy     Hives even to smell of fish   Pollen Extract    Valium [Diazepam]     Does not like way makes her feel    Prior to Admission medications   Medication Sig Start Date End Date Taking? Authorizing Provider  Cholecalciferol (VITAMIN D3) 50000 units CAPS Take 50,000 Units every Thursday by mouth.  09/06/17  Yes [provider]  citalopram (CELEXA) 40 MG tablet Take 0.5-1 tablets (20-40 mg total) by mouth daily. 1/2 pill x 2 weeks then 1 pill daily D/c effexor 37.5 11/20/21  Yes McLean-Scocuzza, Nino Glow, MD  fluticasone (FLONASE) 50 MCG/ACT nasal spray Place 1-2 sprays into both nostrils daily. 11/19/20  Yes McLean-Scocuzza, Nino Glow, MD  ibuprofen (ADVIL,MOTRIN) 200 MG tablet Take 400 mg every 6 (six) hours as needed by mouth for headache.   Yes [provider]  montelukast (SINGULAIR) 10 MG tablet Take 1 tablet (10 mg total) by mouth at bedtime. 11/19/20  Yes McLean-Scocuzza, Nino Glow, MD  phentermine  (ADIPEX-P) 37.5 MG tablet Take 1 tablet (37.5 mg total) by mouth daily before breakfast. Rx 1/2 11/20/21  Yes McLean-Scocuzza, Nino Glow, MD  phentermine (ADIPEX-P) 37.5 MG tablet Take 1 tablet (37.5 mg total) by mouth daily before breakfast. 2/2 11/20/21  Yes McLean-Scocuzza, Nino Glow, MD  Probiotic CAPS Take 1 capsule daily by mouth.   Yes [provider]  temazepam (RESTORIL) 30 MG capsule TAKE 1 CAPSULE(30 MG) BY MOUTH AT BEDTIME AS NEEDED FOR SLEEP 10/29/21  Yes McLean-Scocuzza, Nino Glow, MD  BIOTIN PO Take by mouth. Patient not taking: Reported on 11/20/2021    [provider]   Social History   Socioeconomic History   Marital status: Divorced    Spouse name: Not on file   Number of children: Not on file   Years of education: Not on file   Highest education level: Not on file  Occupational History   Not on file  Tobacco Use   Smoking status: Never   Smokeless tobacco: Never  Vaping Use   Vaping Use: Never used  Substance and Sexual Activity   Alcohol use: Yes    Comment: occasionally   Drug use: No   Sexual activity: Yes    Birth control/protection: Surgical    Comment: Hysterectomy  Other Topics Concern   Not on file  Social History Narrative   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  Coherent responses, all questions were answered with understanding of plan  expressed.  Assessment and Plan: COVID-19 virus infection - Plan: benzonatate (TESSALON) 100 MG capsule, nirmatrelvir/ritonavir EUA (PAXLOVID) 20 x 150 MG & 10 x 100MG TABS No red flags on exam/history.  Low risk of COVID complications score.  Based on age I did offer option of antiviral and timing if she decides to start that medication as well potential side effects, risk, and risk of rebound COVID as well as restorative isolation timing if that were to occur.  Understanding expressed.  Also discussed her current medications and potential interactions with Paxlovid. Symptomatic care discussed with fluids, rest, Mucinex, Tessalon if needed.  Urgent care/ER precautions given as well as masking and  isolation precautions per CDC guidelines.  Follow Up Instructions:    I discussed the assessment and treatment plan with the patient. The patient was provided an opportunity to ask questions and all were answered. The patient agreed with the plan and demonstrated an understanding of the instructions.   The patient was advised to call back or seek an in-person evaluation if the symptoms worsen or if the condition fails to improve as anticipated.   Wendie Agreste, MD

## 2021-12-05 ENCOUNTER — Encounter: Payer: Self-pay | Admitting: Internal Medicine

## 2021-12-18 ENCOUNTER — Ambulatory Visit (INDEPENDENT_AMBULATORY_CARE_PROVIDER_SITE_OTHER): Payer: BC Managed Care – PPO | Admitting: Obstetrics and Gynecology

## 2021-12-18 ENCOUNTER — Encounter: Payer: Self-pay | Admitting: Obstetrics and Gynecology

## 2021-12-18 ENCOUNTER — Other Ambulatory Visit: Payer: Self-pay

## 2021-12-18 ENCOUNTER — Telehealth: Payer: Self-pay | Admitting: Internal Medicine

## 2021-12-18 VITALS — BP 110/70 | Ht 65.0 in | Wt 236.0 lb

## 2021-12-18 DIAGNOSIS — B9689 Other specified bacterial agents as the cause of diseases classified elsewhere: Secondary | ICD-10-CM | POA: Insufficient documentation

## 2021-12-18 DIAGNOSIS — R3121 Asymptomatic microscopic hematuria: Secondary | ICD-10-CM | POA: Insufficient documentation

## 2021-12-18 DIAGNOSIS — N760 Acute vaginitis: Secondary | ICD-10-CM | POA: Diagnosis not present

## 2021-12-18 HISTORY — DX: Asymptomatic microscopic hematuria: R31.21

## 2021-12-18 HISTORY — DX: Other specified bacterial agents as the cause of diseases classified elsewhere: B96.89

## 2021-12-18 LAB — POCT WET PREP WITH KOH
Clue Cells Wet Prep HPF POC: POSITIVE
KOH Prep POC: POSITIVE — AB
Trichomonas, UA: NEGATIVE
Yeast Wet Prep HPF POC: NEGATIVE

## 2021-12-18 LAB — POCT URINALYSIS DIPSTICK
Bilirubin, UA: NEGATIVE
Glucose, UA: NEGATIVE
Ketones, UA: NEGATIVE
Leukocytes, UA: NEGATIVE
Nitrite, UA: NEGATIVE
Protein, UA: NEGATIVE
Spec Grav, UA: 1.01 (ref 1.010–1.025)
pH, UA: 6 (ref 5.0–8.0)

## 2021-12-18 MED ORDER — METRONIDAZOLE 0.75 % VA GEL: 1.0000 | Freq: Every day | VAGINAL | 0 refills | Status: AC

## 2021-12-18 NOTE — Telephone Encounter (Signed)
Pt has appt tomorrow for urine lab but pt is having female problems and she has appt with gynecologist today. Pt want to know if she should still drop urine off or let the gynecologist do it all.

## 2021-12-18 NOTE — Telephone Encounter (Signed)
Patient has been scheduled for 12/18/21 with ABC at 4:15

## 2021-12-18 NOTE — Telephone Encounter (Signed)
Called and informed the Patient that we have her scheduled for a urinalysis and culture. Informed the Patient that if this is completed today with OBGYN it will go in her chart and we will be able to see the result.   Informed the Patient that if there is no urine collected that she will need to keep her lab appointment for tomorrow. Patient verbalized understanding

## 2021-12-18 NOTE — Progress Notes (Signed)
McLean-Scocuzza, Nino Glow, MD   Chief Complaint  Patient presents with   Urinary Tract Infection    Strong odor in mornings, not sure if its urine odor or vaginal odor.    HPI:      [name] is a 51 y.o. [id number] whose LMP was No LMP recorded (lmp unknown). Patient has had a hysterectomy., presents today for odor, unsure if vaginal or urine, worse in AM. No dysuria, urin frequency/urgency. Noted to have blood in her urine with PCP 12/22. Due for f/u UA and C&S. Had neg CT scan per pt (don't see in Epic). No increased vag d/c or irritation. Treated for BV 11/22 with metrogel with sx relief; then had ext vag itching and treated for yeast vag with OTC meds with sx relief. Then had brown d/c, no pelvic pain, but pt unsure if vaginal or urinary now. Sx resolved. Pt is s/p hyst so shouldn't have vag bleeding. Not sex active  Patient Active Problem List   Diagnosis Date Noted   BV (bacterial vaginosis) 12/18/2021   Asymptomatic microscopic hematuria 12/18/2021   Depression, recurrent ([place]) 11/20/2020   PTSD (post-traumatic stress disorder) 07/16/2020   Stress 07/16/2020   Annual physical exam 01/16/2020   Abnormal thyroid function test 06/13/2018   Obesity (BMI 35.0-39.9 without comorbidity) 05/10/2018   Anxiety and depression 02/07/2018   Insomnia 02/07/2018   Leg edema, left 02/07/2018   Family history of breast cancer 12/13/2017   Allergic rhinitis 07/10/2016   Endometriosis 07/10/2016    Past Surgical History:  Procedure Laterality Date   COLONOSCOPY     ~18 yrs ago    ENDOMETRIAL BIOPSY     ESOPHAGOGASTRODUODENOSCOPY     HYSTEROSCOPY  2004   LEG SURGERY  10/13/2017   s/p fall    ORIF ANKLE FRACTURE Left 10/13/2017   Procedure: OPEN REDUCTION INTERNAL FIXATION (ORIF) ANKLE FRACTURE;  Surgeon: Lovell Sheehan, MD;  Location: [facility] OR[facility];  Service: Orthopedics;  Laterality: Left;   TOTAL ABDOMINAL HYSTERECTOMY  01/2005   leio, endometriosis; Dr. Ammie Dalton; has ovaries  and she thinks cervix   UPPER GASTROINTESTINAL ENDOSCOPY      Family History  Problem Relation Age of Onset   Breast cancer Paternal Aunt 49   Diabetes Paternal Aunt    Colon cancer Maternal Grandmother 50   Lung cancer Maternal Grandfather    Prostate cancer Maternal Grandfather    Diabetes Paternal Grandmother    Hypertension Mother    Berenice Primas' disease Mother    Hypertension Brother    Thyroid disease Maternal Aunt    Colon polyps Neg Hx    Esophageal cancer Neg Hx    Rectal cancer Neg Hx    Stomach cancer Neg Hx     Social History   Socioeconomic History   Marital status: Divorced    Spouse name: Not on file   Number of children: Not on file   Years of education: Not on file   Highest education level: Not on file  Occupational History   Not on file  Tobacco Use   Smoking status: Never   Smokeless tobacco: Never  Vaping Use   Vaping Use: Never used  Substance and Sexual Activity   Alcohol use: Yes    Comment: occasionally   Drug use: No   Sexual activity: Not Currently    Birth control/protection: Surgical    Comment: Hysterectomy  Other Topics Concern   Not on file  Social History 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   2   fluticasone (FLONASE) 50 MCG/ACT nasal spray Place 1-2 sprays into both nostrils daily. 16 mL 12   ibuprofen (ADVIL,MOTRIN) 200 MG tablet Take 400  mg every 6 (six) hours as needed by mouth for headache.     montelukast (SINGULAIR) 10 MG tablet Take 1 tablet (10 mg total) by mouth at bedtime. 90 tablet 3   phentermine (ADIPEX-P) 37.5 MG tablet Take 1 tablet (37.5 mg total) by mouth daily before breakfast. Rx 1/2 60 tablet 0   Probiotic CAPS Take 1 capsule daily by mouth.     temazepam (RESTORIL) 30 MG capsule TAKE 1 CAPSULE(30 MG) BY MOUTH AT BEDTIME AS NEEDED FOR SLEEP 30 capsule 2   citalopram (CELEXA) 40 MG tablet Take 0.5-1 tablets (20-40 mg total) by mouth daily. 1/2 pill x 2 weeks then 1 pill daily D/c effexor 37.5 (Patient not taking: Reported on 12/18/2021) 90 tablet 3   benzonatate (TESSALON) 100 MG capsule Take 1 capsule (100 mg total) by mouth 3 (three) times daily as needed for cough. 20 capsule 0   BIOTIN PO Take by mouth. (Patient not taking: Reported on 11/20/2021)     phentermine (ADIPEX-P) 37.5 MG tablet Take 1 tablet (37.5 mg total) by mouth daily before breakfast. 2/2 60 tablet 0   No facility-administered medications prior to visit.      ROS:  Review of Systems  Constitutional:  Negative for fever.  Gastrointestinal:  Negative for blood in stool, constipation, diarrhea, nausea and vomiting.  Genitourinary:  Negative for dyspareunia, dysuria, flank pain, frequency, hematuria, urgency, vaginal bleeding, vaginal discharge and vaginal pain.  Musculoskeletal:  Negative for back pain.  Skin:  Negative for rash.  BREAST: No symptoms   OBJECTIVE:   Vitals:  BP 110/70    Ht 5\' 5"  (1.651 m)    Wt 236 lb (107 kg)    LMP  (LMP Unknown)    BMI 39.27 kg/m   Physical Exam Vitals reviewed.  Constitutional:      Appearance: She is well-developed.  Pulmonary:     Effort: Pulmonary effort is normal.  Genitourinary:    General: Normal vulva.     Pubic Area: No rash.      Labia:        Right: No rash, tenderness or lesion.        Left: No rash, tenderness or lesion.      Vagina: Vaginal discharge present. No erythema or  tenderness.     Uterus: Not enlarged and not tender.      Adnexa: Right adnexa normal and left adnexa normal.       Right: No mass or tenderness.         Left: No mass or tenderness.  Musculoskeletal:        General: Normal range of motion.     Cervical back: Normal range of motion.  Skin:    General: Skin is warm and dry.  Neurological:     General: No focal deficit present.     Mental Status: She is alert and oriented to person, place, and time.  Psychiatric:        Mood and Affect: Mood normal.        Behavior: Behavior normal.        Thought Content: Thought content normal.        Judgment: Judgment normal.    Results: Results for orders placed or performed in visit on 12/18/21 (from the past 24 hour(s))  POCT Urinalysis Dipstick     Status: Normal   Collection Time: 12/18/21  4:45 PM  Result Value Ref Range   Color, UA yellow    Clarity, UA clear    Glucose, UA Negative Negative   Bilirubin, UA neg    Ketones, UA neg    Spec Grav, UA 1.010 1.010 - 1.025   Blood, UA small    pH, UA 6.0 5.0 - 8.0   Protein, UA Negative Negative   Urobilinogen, UA     Nitrite, UA neg    Leukocytes, UA Negative Negative   Appearance     Odor    POCT Wet Prep with KOH     Status: Abnormal   Collection Time: 12/18/21  4:46 PM  Result Value Ref Range   Trichomonas, UA Negative    Clue Cells Wet Prep HPF POC pos    Epithelial Wet Prep HPF POC     Yeast Wet Prep HPF POC neg    Bacteria Wet Prep HPF POC     RBC Wet Prep HPF POC     WBC Wet Prep HPF POC     KOH Prep POC Positive (A) Negative     Assessment/Plan: BV (bacterial vaginosis) - Plan: metroNIDAZOLE (METROGEL) 0.75 % vaginal gel, POCT Wet Prep with KOH; pos sx and wet prep. Rx metrogel. Cont probiotics. Will try clindamycin if sx recur in near future. F/u prn.   Asymptomatic microscopic hematuria - Plan: Urinalysis, Routine w reflex microscopic, Urine Culture, POCT Urinalysis Dipstick; pos blood on POC dipstick UA. Check  [facility] UA and C&S. Will send results to PCP.    Meds ordered this encounter  Medications   metroNIDAZOLE (METROGEL) 0.75 % vaginal gel    Sig: Place 1 Applicatorful vaginally at bedtime for 5 days.    Dispense:  50 g    Refill:  0    Order Specific Question:   Supervising Provider    Answer:   Gae Dry [[id number]]      Return if symptoms worsen or fail to improve.  Carsten Carstarphen B. Shirley Bolle, PA-C 12/18/2021 4:50 PM

## 2021-12-19 ENCOUNTER — Other Ambulatory Visit: Payer: BC Managed Care – PPO

## 2021-12-19 LAB — URINALYSIS, ROUTINE W REFLEX MICROSCOPIC
Bilirubin, UA: NEGATIVE
Glucose, UA: NEGATIVE
Ketones, UA: NEGATIVE
Leukocytes,UA: NEGATIVE
Nitrite, UA: NEGATIVE
Protein,UA: NEGATIVE
RBC, UA: NEGATIVE
Specific Gravity, UA: 1.012 (ref 1.005–1.030)
Urobilinogen, Ur: 0.2 mg/dL (ref 0.2–1.0)
pH, UA: 6.5 (ref 5.0–7.5)

## 2021-12-19 NOTE — Progress Notes (Signed)
FYI [facility] UA results. No hematuria. Awaiting culture. Erica Mccall

## 2021-12-19 NOTE — Progress Notes (Signed)
I sent her a MyChart message and will make sure she sees it.

## 2021-12-20 LAB — URINE CULTURE

## 2021-12-21 NOTE — Progress Notes (Signed)
Urine culture neg as well. Pt notified via [place].

## 2022-01-21 ENCOUNTER — Ambulatory Visit (INDEPENDENT_AMBULATORY_CARE_PROVIDER_SITE_OTHER): Payer: BC Managed Care – PPO | Admitting: Obstetrics and Gynecology

## 2022-01-21 ENCOUNTER — Other Ambulatory Visit: Payer: Self-pay

## 2022-01-21 ENCOUNTER — Encounter: Payer: Self-pay | Admitting: Obstetrics and Gynecology

## 2022-01-21 VITALS — BP 120/80 | Ht 65.0 in | Wt 231.0 lb

## 2022-01-21 DIAGNOSIS — Z803 Family history of malignant neoplasm of breast: Secondary | ICD-10-CM

## 2022-01-21 DIAGNOSIS — Z1231 Encounter for screening mammogram for malignant neoplasm of breast: Secondary | ICD-10-CM

## 2022-01-21 DIAGNOSIS — Z01419 Encounter for gynecological examination (general) (routine) without abnormal findings: Secondary | ICD-10-CM

## 2022-01-21 NOTE — Progress Notes (Signed)
PCP:  McLean-Scocuzza, Nino Glow, MD   Chief Complaint  Patient presents with   Gynecologic Exam    No concerns     HPI:      [name] is a 51 y.o. [id number] who LMP was No LMP recorded (lmp unknown). Patient has had a hysterectomy., presents today for her annual examination.  Her menses are absent due to TAH for leio/endometriosis. She does not have PMB. No vasomotor sx.  Sex activity: single partner, contraception - status post hysterectomy.  Last Pap:  01/16/21 and 01/15/20 Results were normal/ neg HPV DNA. 12/14/18  Results were ASCUS with pos HPV DNA; colpo with Dr. Glennon Mac 1/20 with neg bx; repeat pap Q3 yrs now Hx of STDs: HPV on pap  Treated for BV with metrogel 12/18/21 with sx relief. States pH is off when she checks it on "strips" but no vag sx. Hx of BV and yeast in past.   Last mammogram: 01/08/21 Results were: normal--routine follow-up in 12 months; has order through PCP There is a FH of breast cancer in her pat aunt. Pt is BRCA neg 2014, IBIS=16.8%. Pt has declined update testing in past. There is no FH of ovarian cancer. The patient does do self-breast exams.  Tobacco use: The patient denies current or previous tobacco use. Alcohol use: none No drug use.  Exercise: moderately active  She does get adequate calcium and Vitamin D in her diet.  Colonoscopy:  3/22 at [facility], repeat after 10 yrs per pt; FH in [facility] about age 89  Labs with PCP.    Past Medical History:  Diagnosis Date   Allergy    Anxiety    Anxiety    ASCUS with positive high risk HPV cervical 12/2018   Blood in stool    BV (bacterial vaginosis)    Chicken pox    Endometriosis    Headache    Insomnia    Leiomyoma of uterus    Menorrhagia    Seasonal allergies    UTI (urinary tract infection)    Vitamin D deficiency     Past Surgical History:  Procedure Laterality Date   COLONOSCOPY     ~18 yrs ago    ENDOMETRIAL BIOPSY     ESOPHAGOGASTRODUODENOSCOPY     HYSTEROSCOPY  2004   LEG  SURGERY  10/13/2017   s/p fall    ORIF ANKLE FRACTURE Left 10/13/2017   Procedure: OPEN REDUCTION INTERNAL FIXATION (ORIF) ANKLE FRACTURE;  Surgeon: Lovell Sheehan, MD;  Location: [facility] ORS;  Service: Orthopedics;  Laterality: Left;   TOTAL ABDOMINAL HYSTERECTOMY  01/2005   leio, endometriosis; Dr. Ammie Dalton; has ovaries and she thinks cervix   UPPER GASTROINTESTINAL ENDOSCOPY      Family History  Problem Relation Age of Onset   Breast cancer Paternal Aunt 52   Diabetes Paternal Aunt    Colon cancer Maternal Grandmother 50   Lung cancer Maternal Grandfather    Prostate cancer Maternal Grandfather    Diabetes Paternal Grandmother    Hypertension Mother    Berenice Primas' disease Mother    Hypertension Brother    Thyroid disease Maternal Aunt    Colon polyps Neg Hx    Esophageal cancer Neg Hx    Rectal cancer Neg Hx    Stomach cancer Neg Hx     Social History   Socioeconomic History   Marital status: Divorced    Spouse name: Not on file   Number of children: Not on file  Years of education: Not on file   Highest education level: Not on file  Occupational History   Not on file  Tobacco Use   Smoking status: Never   Smokeless tobacco: Never  Vaping Use   Vaping Use: Never used  Substance and Sexual Activity   Alcohol use: Yes    Comment: occasionally   Drug use: No   Sexual activity: Not Currently    Birth control/protection: Surgical    Comment: Hysterectomy  Other Topics Concern   Not on file  Social History Narrative   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    fluticasone (FLONASE) 50 MCG/ACT nasal spray Place 1-2 sprays into both nostrils daily.   ibuprofen (ADVIL,MOTRIN) 200 MG tablet Take 400 mg every 6 (six) hours as needed by mouth for headache.   montelukast (SINGULAIR) 10 MG tablet Take 1 tablet (10 mg total) by mouth at bedtime.   phentermine (ADIPEX-P) 37.5 MG tablet Take 1 tablet (37.5 mg total) by mouth daily before breakfast. Rx 1/2   Probiotic CAPS Take 1 capsule daily by mouth.   temazepam (RESTORIL) 30 MG capsule TAKE 1 CAPSULE(30 MG) BY MOUTH AT BEDTIME AS NEEDED FOR SLEEP     ROS:  Review of Systems  Constitutional:  Negative for fatigue, fever and unexpected weight change.  Respiratory:  Negative for cough, shortness of breath and wheezing.   Cardiovascular:  Negative for chest pain, palpitations and leg swelling.  Gastrointestinal:  Negative for blood in stool, constipation, diarrhea, nausea and vomiting.  Endocrine: Negative for cold intolerance, heat intolerance and polyuria.  Genitourinary:  Negative for dyspareunia, dysuria, flank pain, frequency, genital sores, hematuria, menstrual problem, pelvic pain, urgency, vaginal bleeding, vaginal discharge and vaginal pain.  Musculoskeletal:  Negative for back pain, joint swelling and myalgias.  Skin:  Negative for rash.  Neurological:  Negative for dizziness, syncope, light-headedness, numbness and headaches.  Hematological:  Negative for adenopathy.  Psychiatric/Behavioral:  Negative for agitation, confusion, sleep disturbance and suicidal ideas. The patient is not nervous/anxious.     Objective: BP 120/80    Ht '5\' 5"'  (1.651 m)    Wt 231 lb (104.8 kg)    LMP  (LMP Unknown)    BMI 38.44 kg/m    Physical Exam Constitutional:      Appearance: She is well-developed.  Genitourinary:     Vulva normal.     Genitourinary Comments: UTERUS/CX  SURG REM     Right Labia: No rash, tenderness or lesions.    Left Labia: No tenderness, lesions or rash.    Vaginal cuff intact.    No vaginal discharge, erythema, tenderness or bleeding.      Right Adnexa: not tender and no mass present.    Left Adnexa: not tender and no mass present.    Cervix is  absent.     No cervical friability or polyp.     Uterus is not enlarged or tender.     Uterus is absent.  Breasts:    Right: No mass, nipple discharge, skin change or tenderness.     Left: No mass, nipple discharge, skin change or tenderness.  Neck:     Thyroid: No thyromegaly.  Cardiovascular:     Rate and Rhythm: Normal rate and regular rhythm.     Heart sounds: Normal heart sounds. No murmur heard. Pulmonary:     Effort: Pulmonary effort is normal.     Breath sounds: Normal breath sounds.  Abdominal:     Palpations: Abdomen is soft.     Tenderness: There is no abdominal tenderness. There is no guarding or rebound.  Musculoskeletal:        General: Normal range of motion.     Cervical back: Normal range of motion.  Lymphadenopathy:     Cervical: No cervical adenopathy.  Neurological:     General: No focal deficit present.     Mental Status: She is alert and oriented to person, place, and time.     Cranial Nerves: No cranial nerve deficit.  Skin:    General: Skin is warm and dry.  Psychiatric:        Mood and Affect: Mood normal.        Behavior: Behavior normal.        Thought Content: Thought content normal.        Judgment: Judgment normal.  Vitals and nursing note reviewed.    Assessment/Plan: Encounter for annual routine gynecological examination  Encounter for screening mammogram for malignant neoplasm of breast; pt has mammo order through PCP  Family history of breast cancer--pt is BRCA neg; has declined update testing. F/u prn.    GYN counsel breast self exam, mammography screening, adequate intake of calcium and vitamin D, diet and exercise     F/U  Return  in about 1 year (around 01/21/2023).  Meer Reindl B. Vicenta Olds, PA-C 01/21/2022 10:36 AM

## 2022-01-21 NOTE — Patient Instructions (Signed)
I value your feedback and you entrusting us with your care. If you get a [facility] patient survey, I would appreciate you taking the time to let us know about your experience today. Thank you! ? ? ?

## 2022-01-27 ENCOUNTER — Other Ambulatory Visit: Payer: Self-pay | Admitting: Internal Medicine

## 2022-01-28 ENCOUNTER — Other Ambulatory Visit: Payer: Self-pay | Admitting: Internal Medicine

## 2022-01-28 ENCOUNTER — Encounter: Payer: Self-pay | Admitting: Internal Medicine

## 2022-01-30 ENCOUNTER — Other Ambulatory Visit: Payer: Self-pay | Admitting: Internal Medicine

## 2022-02-02 ENCOUNTER — Other Ambulatory Visit: Payer: Self-pay

## 2022-02-02 ENCOUNTER — Encounter: Payer: Self-pay | Admitting: Internal Medicine

## 2022-02-02 NOTE — Telephone Encounter (Signed)
Pended for your approval or denial.  When ordered 01/28/22 medication was set to print and not be e-scribed. Patient last seen 11/20/21.

## 2022-02-02 NOTE — Telephone Encounter (Signed)
Pt called in requesting refill on medication (temazepam (RESTORIL) 30 MG capsule). Pt requesting for callback

## 2022-02-03 MED ORDER — TEMAZEPAM 30 MG PO CAPS: ORAL_CAPSULE | ORAL | 2 refills | Status: DC

## 2022-02-26 ENCOUNTER — Other Ambulatory Visit: Payer: Self-pay

## 2022-02-26 ENCOUNTER — Ambulatory Visit
Admission: RE | Admit: 2022-02-26 | Discharge: 2022-02-26 | Disposition: A | Discharge status: Home / Self Care | Payer: BC Managed Care – PPO | Source: Ambulatory Visit | Attending: Internal Medicine | Admitting: Internal Medicine

## 2022-02-26 DIAGNOSIS — Z1231 Encounter for screening mammogram for malignant neoplasm of breast: Secondary | ICD-10-CM | POA: Diagnosis not present

## 2022-03-04 DIAGNOSIS — Y92410 Unspecified street and highway as the place of occurrence of the external cause: Secondary | ICD-10-CM | POA: Diagnosis not present

## 2022-03-04 DIAGNOSIS — S4991XA Unspecified injury of right shoulder and upper arm, initial encounter: Secondary | ICD-10-CM | POA: Diagnosis not present

## 2022-03-04 DIAGNOSIS — S43004A Unspecified dislocation of right shoulder joint, initial encounter: Secondary | ICD-10-CM | POA: Diagnosis not present

## 2022-03-04 DIAGNOSIS — M542 Cervicalgia: Secondary | ICD-10-CM | POA: Diagnosis not present

## 2022-05-03 ENCOUNTER — Other Ambulatory Visit: Payer: Self-pay | Admitting: Internal Medicine

## 2022-05-25 DIAGNOSIS — M542 Cervicalgia: Secondary | ICD-10-CM | POA: Diagnosis not present

## 2022-05-25 DIAGNOSIS — M436 Torticollis: Secondary | ICD-10-CM | POA: Diagnosis not present

## 2022-06-24 ENCOUNTER — Ambulatory Visit: Payer: BC Managed Care – PPO | Admitting: Internal Medicine

## 2022-06-24 ENCOUNTER — Encounter: Payer: Self-pay | Admitting: Internal Medicine

## 2022-06-24 VITALS — BP 126/72 | HR 95 | Temp 97.9°F | Ht 65.0 in | Wt 232.4 lb

## 2022-06-24 DIAGNOSIS — E669 Obesity, unspecified: Secondary | ICD-10-CM | POA: Diagnosis not present

## 2022-06-24 DIAGNOSIS — R739 Hyperglycemia, unspecified: Secondary | ICD-10-CM

## 2022-06-24 DIAGNOSIS — R110 Nausea: Secondary | ICD-10-CM

## 2022-06-24 DIAGNOSIS — L299 Pruritus, unspecified: Secondary | ICD-10-CM

## 2022-06-24 DIAGNOSIS — Z0000 Encounter for general adult medical examination without abnormal findings: Secondary | ICD-10-CM

## 2022-06-24 DIAGNOSIS — E785 Hyperlipidemia, unspecified: Secondary | ICD-10-CM | POA: Insufficient documentation

## 2022-06-24 DIAGNOSIS — Z6838 Body mass index (BMI) 38.0-38.9, adult: Secondary | ICD-10-CM | POA: Diagnosis not present

## 2022-06-24 DIAGNOSIS — J309 Allergic rhinitis, unspecified: Secondary | ICD-10-CM

## 2022-06-24 DIAGNOSIS — Z1329 Encounter for screening for other suspected endocrine disorder: Secondary | ICD-10-CM

## 2022-06-24 DIAGNOSIS — Z1389 Encounter for screening for other disorder: Secondary | ICD-10-CM

## 2022-06-24 DIAGNOSIS — E559 Vitamin D deficiency, unspecified: Secondary | ICD-10-CM

## 2022-06-24 DIAGNOSIS — H6690 Otitis media, unspecified, unspecified ear: Secondary | ICD-10-CM

## 2022-06-24 DIAGNOSIS — Z23 Encounter for immunization: Secondary | ICD-10-CM | POA: Diagnosis not present

## 2022-06-24 DIAGNOSIS — Z1231 Encounter for screening mammogram for malignant neoplasm of breast: Secondary | ICD-10-CM

## 2022-06-24 HISTORY — DX: Body mass index (BMI) 38.0-38.9, adult: Z68.38

## 2022-06-24 MED ORDER — PHENTERMINE HCL 37.5 MG PO TABS: 37.5000 mg | ORAL_TABLET | Freq: Every day | ORAL | 0 refills | Status: DC

## 2022-06-24 MED ORDER — CITALOPRAM HYDROBROMIDE 40 MG PO TABS: 40.0000 mg | ORAL_TABLET | Freq: Every day | ORAL | 3 refills | Status: DC

## 2022-06-24 MED ORDER — INSULIN PEN NEEDLE 30G X 8 MM MISC: 1.0000 | 3 refills | Status: DC | PRN

## 2022-06-24 MED ORDER — TEMAZEPAM 30 MG PO CAPS
ORAL_CAPSULE | ORAL | 5 refills | Status: DC
Start: 2022-06-24 — End: 2022-12-30

## 2022-06-24 MED ORDER — CIPROFLOXACIN-DEXAMETHASONE 0.3-0.1 % OT SUSP: 4.0000 [drp] | Freq: Two times a day (BID) | OTIC | 0 refills | Status: DC

## 2022-06-24 MED ORDER — SAXENDA 18 MG/3ML ~~LOC~~ SOPN: 0.6000 mg | PEN_INJECTOR | Freq: Every day | SUBCUTANEOUS | 3 refills | Status: DC

## 2022-06-24 MED ORDER — MONTELUKAST SODIUM 10 MG PO TABS: 10.0000 mg | ORAL_TABLET | Freq: Every day | ORAL | 3 refills | Status: DC

## 2022-06-24 MED ORDER — ONDANSETRON HCL 4 MG PO TABS: 4.0000 mg | ORAL_TABLET | Freq: Three times a day (TID) | ORAL | 0 refills | Status: DC | PRN

## 2022-06-24 NOTE — Progress Notes (Signed)
Chief Complaint  Patient presents with   Weight Loss    Wants to discuss wt loss options and left ear has been itchy and feels crusty (ear issues have been going on for a couple of months)   F/u  1. HLD/Obesity wants meds for wt loss tried adipex 37.5 but still binge eating with stress  2. Left ear itching x 2-3 months and feels crusty  3. Anxiety/insomnia on celexa 40 mg and restoril 30 mg qhs 4. Wants shingrix    Review of Systems  Constitutional:  Negative for weight loss.  HENT:  Negative for hearing loss.        +ear itching  Eyes:  Negative for blurred vision.  Respiratory:  Negative for shortness of breath.   Cardiovascular:  Negative for chest pain.  Gastrointestinal:  Negative for abdominal pain and blood in stool.  Genitourinary:  Negative for dysuria.  Musculoskeletal:  Negative for falls and joint pain.  Skin:  Positive for itching. Negative for rash.  Neurological:  Negative for headaches.  Psychiatric/Behavioral:  Negative for depression.    Past Medical History:  Diagnosis Date   Allergy    Anxiety    Anxiety    ASCUS with positive high risk HPV cervical 12/2018   Blood in stool    BV (bacterial vaginosis)    Chicken pox    Endometriosis    Headache    Insomnia    Leiomyoma of uterus    Menorrhagia    Seasonal allergies    UTI (urinary tract infection)    Vitamin D deficiency    Past Surgical History:  Procedure Laterality Date   COLONOSCOPY     ~18 yrs ago    ENDOMETRIAL BIOPSY     ESOPHAGOGASTRODUODENOSCOPY     HYSTEROSCOPY  2004   LEG SURGERY  10/13/2017   s/p fall    ORIF ANKLE FRACTURE Left 10/13/2017   Procedure: OPEN REDUCTION INTERNAL FIXATION (ORIF) ANKLE FRACTURE;  Surgeon: Lovell Sheehan, MD;  Location: [facility];  Service: Orthopedics;  Laterality: Left;   TOTAL ABDOMINAL HYSTERECTOMY  01/2005   leio, endometriosis; Dr. Ammie Dalton; has ovaries and she thinks cervix   UPPER GASTROINTESTINAL ENDOSCOPY     Family History  Problem  Relation Age of Onset   Breast cancer Mother 84   Hypertension Mother    Berenice Primas' disease Mother    Transient ischemic attack Mother    Aneurysm Mother        x3 brain   Aortic aneurysm Mother    Hypertension Brother    Colon cancer Maternal Grandmother 15   Lung cancer Maternal Grandfather    Prostate cancer Maternal Grandfather    Diabetes Paternal Grandmother    Thyroid disease Maternal Aunt    Breast cancer Paternal Aunt 63   Diabetes Paternal Aunt    Colon polyps Neg Hx    Esophageal cancer Neg Hx    Rectal cancer Neg Hx    Stomach cancer Neg Hx    Social History   Socioeconomic History   Marital status: Divorced    Spouse name: Not on file   Number of children: Not on file   Years of education: Not on file   Highest education level: Not on file  Occupational History   Not on file  Tobacco Use   Smoking status: Never   Smokeless tobacco: Never  Vaping Use   Vaping Use: Never used  Substance and Sexual Activity   Alcohol use: Yes  Comment: occasionally   Drug use: No   Sexual activity: Not Currently    Birth control/protection: Surgical    Comment: Hysterectomy  Other Topics Concern   Not on file  Social History Narrative   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    Wears seat belts    No guns    1 son in [place] lives in [place], 1 granddaughter age 70 y.o as of 06/2018 lives in [place] with her mother      Social Determinants of Health   Financial Resource Strain: Not on file  Food Insecurity: Not on file  Transportation Needs: Not on file  Physical Activity: Not on file  Stress: Not on file  Social Connections: Not on file  Intimate Partner Violence: Not on file   Current Meds  Medication Sig   Cholecalciferol (VITAMIN D3) 50000 units CAPS Take 50,000 Units every Thursday by mouth.    ciprofloxacin-dexamethasone (CIPRODEX) OTIC suspension Place 4 drops into the left ear 2 (two) times daily. 4-7 days   fluticasone (FLONASE) 50 MCG/ACT nasal  spray Place 1-2 sprays into both nostrils daily.   ibuprofen (ADVIL,MOTRIN) 200 MG tablet Take 400 mg every 6 (six) hours as needed by mouth for headache.   Insulin Pen Needle (NOVOFINE) 30G X 8 MM MISC Inject 10 each into the skin as needed.   Liraglutide -Weight Management (SAXENDA) 18 MG/3ML SOPN Inject 0.6 mg into the skin daily. X 1 week, 1.2 qd x 1 week, 1.8qd x 1 week, 2.4 qd x 1 weej   ondansetron (ZOFRAN) 4 MG tablet Take 1 tablet (4 mg total) by mouth every 8 (eight) hours as needed.   Probiotic CAPS Take 1 capsule daily by mouth.   [DISCONTINUED] citalopram (CELEXA) 40 MG tablet Take 0.5-1 tablets (20-40 mg total) by mouth daily. 1/2 pill x 2 weeks then 1 pill daily D/c effexor 37.5   [DISCONTINUED] montelukast (SINGULAIR) 10 MG tablet Take 1 tablet (10 mg total) by mouth at bedtime.   [DISCONTINUED] phentermine (ADIPEX-P) 37.5 MG tablet Take 1 tablet (37.5 mg total) by mouth daily before breakfast. Rx 1/2   Allergies  Allergen Reactions   Iodine Hives   Adipex-P [Phentermine]     jittery   Fish Allergy     Hives even to smell of fish   Pollen Extract    Valium [Diazepam]     Does not like way makes her feel    No results found for this or any previous visit (from the past 2160 hour(s)). Objective  Body mass index is 38.67 kg/m. Wt Readings from Last 3 Encounters:  06/24/22 232 lb 6.4 oz (105.4 kg)  01/21/22 231 lb (104.8 kg)  12/18/21 236 lb (107 kg)   Temp Readings from Last 3 Encounters:  06/24/22 97.9 F (36.6 C) (Oral)  12/04/21 99.3 F (37.4 C) (Oral)  11/20/21 (!) 97 F (36.1 C) (Temporal)   BP Readings from Last 3 Encounters:  06/24/22 126/72  01/21/22 120/80  12/18/21 110/70   Pulse Readings from Last 3 Encounters:  06/24/22 95  11/20/21 86  02/07/21 (!) 59    Physical Exam Vitals and nursing note reviewed.  Constitutional:      Appearance: Normal appearance. She is well-developed and well-groomed.  HENT:     Head: Normocephalic and  atraumatic.  Eyes:     Conjunctiva/sclera: Conjunctivae normal.     Pupils: Pupils are equal, round, and reactive to light.  Cardiovascular:  Rate and Rhythm: Normal rate and regular rhythm.     Heart sounds: Normal heart sounds. No murmur heard. Pulmonary:     Effort: Pulmonary effort is normal.     Breath sounds: Normal breath sounds.  Abdominal:     General: Abdomen is flat. Bowel sounds are normal.     Tenderness: There is no abdominal tenderness.  Musculoskeletal:        General: No tenderness.  Skin:    General: Skin is warm and dry.  Neurological:     General: No focal deficit present.     Mental Status: She is alert and oriented to person, place, and time. Mental status is at baseline.     Cranial Nerves: Cranial nerves 2-12 are intact.     Motor: Motor function is intact.     Coordination: Coordination is intact.     Gait: Gait is intact.  Psychiatric:        Attention and Perception: Attention and perception normal.        Mood and Affect: Mood and affect normal.        Speech: Speech normal.        Behavior: Behavior normal. Behavior is cooperative.        Thought Content: Thought content normal.        Cognition and Memory: Cognition and memory normal.        Judgment: Judgment normal.     Assessment  Plan  BMI 38.0-38.9,adult - Plan: Liraglutide -Weight Management (SAXENDA) 18 MG/3ML SOPN, Insulin Pen Needle (NOVOFINE) 30G X 8 MM MISC, phentermine (ADIPEX-P) 37.5 MG tablet Can switch to wegovy   Hyperlipidemia, unspecified hyperlipidemia type - Plan: Liraglutide -Weight Management (SAXENDA) 18 MG/3ML SOPN, Insulin Pen Needle (NOVOFINE) 30G X 8 MM MISC  Allergic rhinitis, unspecified seasonality, unspecified trigger - Plan: montelukast (SINGULAIR) 10 MG tablet   Nausea - Plan: ondansetron (ZOFRAN) 4 MG tablet prn saxenda   Ear itching - Plan: ciprofloxacin-dexamethasone (CIPRODEX) OTIC suspension, Ambulatory referral to ENT  Acute otitis media,  unspecified otitis media type - Plan: ciprofloxacin-dexamethasone (CIPRODEX) OTIC suspension, Ambulatory referral to ENT  HM Flu shot utd Tdap had 2015/2016 per pt  Shingrix 1/2 today repeat 2nd dose in 2-<6 months   Declines hep B titer  MMR immune moderna 3/3 consider 4th dose    Pap [facility] 12/2018 ascus f/u had 01/15/20 negative neg HPV S/p partial hysterectomy for endometriosis still with ovaries and cervix [facility]   mammogram  02/26/22  negative ordered    Colonoscopy age 55-49 screening 02/07/21 repeat 10 years   rec exercise and healthy diet choices    Provider: Dr. Olivia Mackie McLean-Scocuzza-Internal Medicine

## 2022-06-24 NOTE — Patient Instructions (Addendum)
ENT referral   Semaglutide Injection (Weight Management) What is this medication? SEMAGLUTIDE (SEM a GLOO tide) promotes weight loss. It may also be used to maintain weight loss. It works by decreasing appetite. Changes to diet and exercise are often combined with this medication. This medicine may be used for other purposes; ask your health care provider or pharmacist if you have questions. COMMON BRAND NAME(S): JGGEZM What should I tell my care team before I take this medication? They need to know if you have any of these conditions: Endocrine tumors (MEN 2) or if someone in your family had these tumors Eye disease, vision problems Gallbladder disease History of depression or mental health disease History of pancreatitis Kidney disease Stomach or intestine problems Suicidal thoughts, plans, or attempt; a previous suicide attempt by you or a family member Thyroid cancer or if someone in your family had thyroid cancer An unusual or allergic reaction to semaglutide, other medications, foods, dyes, or preservatives Pregnant or trying to get pregnant Breast-feeding How should I use this medication? This medication is injected under the skin. You will be taught how to prepare and give it. Take it as directed on the prescription label. It is given once every week (every 7 days). Keep taking it unless your care team tells you to stop. It is important that you put your used needles and pens in a special sharps container. Do not put them in a trash can. If you do not have a sharps container, call your pharmacist or care team to get one. A special MedGuide will be given to you by the pharmacist with each prescription and refill. Be sure to read this information carefully each time. This medication comes with INSTRUCTIONS FOR USE. Ask your pharmacist for directions on how to use this medication. Read the information carefully. Talk to your pharmacist or care team if you have questions. Talk to your  care team about the use of this medication in children. While it may be prescribed for children as young as 12 years for selected conditions, precautions do apply. Overdosage: If you think you have taken too much of this medicine contact a poison control center or emergency room at once. NOTE: This medicine is only for you. Do not share this medicine with others. What if I miss a dose? If you miss a dose and the next scheduled dose is more than 2 days away, take the missed dose as soon as possible. If you miss a dose and the next scheduled dose is less than 2 days away, do not take the missed dose. Take the next dose at your regular time. Do not take double or extra doses. If you miss your dose for 2 weeks or more, take the next dose at your regular time or call your care team to talk about how to restart this medication. What may interact with this medication? Insulin and other medications for diabetes This list may not describe all possible interactions. Give your health care provider a list of all the medicines, herbs, non-prescription drugs, or dietary supplements you use. Also tell them if you smoke, drink alcohol, or use illegal drugs. Some items may interact with your medicine. What should I watch for while using this medication? Visit your care team for regular checks on your progress. It may be some time before you see the benefit from this medication. Drink plenty of fluids while taking this medication. Check with your care team if you have severe diarrhea, nausea, and vomiting, or  if you sweat a lot. The loss of too much body fluid may make it dangerous for you to take this medication. This medication may affect blood sugar levels. Ask your care team if changes in diet or medications are needed if you have diabetes. If you or your family notice any changes in your behavior, such as new or worsening depression, thoughts of harming yourself, anxiety, other unusual or disturbing thoughts, or  memory loss, call your care team right away. Women should inform their care team if they wish to become pregnant or think they might be pregnant. Losing weight while pregnant is not advised and may cause harm to the unborn child. Talk to your care team for more information. What side effects may I notice from receiving this medication? Side effects that you should report to your care team as soon as possible: Allergic reactions--skin rash, itching, hives, swelling of the face, lips, tongue, or throat Change in vision Dehydration--increased thirst, dry mouth, feeling faint or lightheaded, headache, dark yellow or brown urine Gallbladder problems--severe stomach pain, nausea, vomiting, fever Heart palpitations--rapid, pounding, or irregular heartbeat Kidney injury--decrease in the amount of urine, swelling of the ankles, hands, or feet Pancreatitis--severe stomach pain that spreads to your back or gets worse after eating or when touched, fever, nausea, vomiting Thoughts of suicide or self-harm, worsening mood, feelings of depression Thyroid cancer--new mass or lump in the neck, pain or trouble swallowing, trouble breathing, hoarseness Side effects that usually do not require medical attention (report to your care team if they continue or are bothersome): Diarrhea Loss of appetite Nausea Stomach pain Vomiting This list may not describe all possible side effects. Call your doctor for medical advice about side effects. You may report side effects to FDA at 1-800-FDA-1088. Where should I keep my medication? Keep out of the reach of children and pets. Refrigeration (preferred): Store in the refrigerator. Do not freeze. Keep this medication in the original container until you are ready to take it. Get rid of any unused medication after the expiration date. Room temperature: If needed, prior to cap removal, the pen can be stored at room temperature for up to 28 days. Protect from light. If it is stored  at room temperature, get rid of any unused medication after 28 days or after it expires, whichever is first. It is important to get rid of the medication as soon as you no longer need it or it is expired. You can do this in two ways: Take the medication to a medication take-back program. Check with your pharmacy or law enforcement to find a location. If you cannot return the medication, follow the directions in the [place]. NOTE: This sheet is a summary. It may not cover all possible information. If you have questions about this medicine, talk to your doctor, pharmacist, or health care provider.  2023 Elsevier/Gold Standard (2021-12-10 00:00:00)  Liraglutide Injection (Weight Management) What is this medication? LIRAGLUTIDE (LIR a GLOO tide) promotes weight loss. It may also be used to maintain weight loss. It works by decreasing appetite. Changes to diet and exercise are often combined with this medication. This medicine may be used for other purposes; ask your health care provider or pharmacist if you have questions. COMMON BRAND NAME(S): Saxenda What should I tell my care team before I take this medication? They need to know if you have any of these conditions: Endocrine tumors (MEN 2) or if someone in your family had these tumors Gallbladder disease High cholesterol History of  alcohol abuse problem History of pancreatitis Kidney disease or if you are on dialysis Liver disease Previous swelling of the tongue, face, or lips with difficulty breathing, difficulty swallowing, hoarseness, or tightening of the throat Stomach problems Suicidal thoughts, plans, or attempt; a previous suicide attempt by you or a family member Thyroid cancer or if someone in your family had thyroid cancer An unusual or allergic reaction to liraglutide, other medications, foods, dyes, or preservatives Pregnant or trying to get pregnant Breast-feeding How should I use this medication? This medication is for  injection under the skin of your upper leg, stomach area, or upper arm. You will be taught how to prepare and give this medication. Use exactly as directed. Take your medication at regular intervals. Do not take it more often than directed. This medication comes with INSTRUCTIONS FOR USE. Ask your pharmacist for directions on how to use this medication. Read the information carefully. Talk to your pharmacist or care team if you have questions. It is important that you put your used needles and syringes in a special sharps container. Do not put them in a trash can. If you do not have a sharps container, call your pharmacist or care team to get one. A special MedGuide will be given to you by the pharmacist with each prescription and refill. Be sure to read this information carefully each time. Talk to your care team about the use of this medication in children. While it may be prescribed for children as young as 44 years of age for selected conditions, precautions do apply. Overdosage: If you think you have taken too much of this medicine contact a poison control center or emergency room at once. NOTE: This medicine is only for you. Do not share this medicine with others. What if I miss a dose? If you miss a dose, take it as soon as you can. If it is almost time for your next dose, take only that dose. Do not take double or extra doses. If you miss your dose for 3 days or more, call your care team to talk about how to restart this medicine. What may interact with this medication? Insulin and other medications for diabetes This list may not describe all possible interactions. Give your health care provider a list of all the medicines, herbs, non-prescription drugs, or dietary supplements you use. Also tell them if you smoke, drink alcohol, or use illegal drugs. Some items may interact with your medicine. What should I watch for while using this medication? Visit your care team for regular checks on your  progress. Drink plenty of fluids while taking this medication. Check with your care team if you get an attack of severe diarrhea, nausea, and vomiting. The loss of too much body fluid can make it dangerous for you to take this medication. This medication may affect blood sugar levels. Ask your care team if changes in diet or medications are needed if you have diabetes. Patients and their families should watch out for worsening depression or thoughts of suicide. Also watch out for sudden changes in feelings such as feeling anxious, agitated, panicky, irritable, hostile, aggressive, impulsive, severely restless, overly excited and hyperactive, or not being able to sleep. If this happens, especially at the beginning of treatment or after a change in dose, call your care team. Women should inform their care team if they wish to become pregnant or think they might be pregnant. Losing weight while pregnant is not advised and may cause harm to the  unborn child. Talk to your care team for more information. What side effects may I notice from receiving this medication? Side effects that you should report to your care team as soon as possible: Allergic reactions or angioedema--skin rash, itching, hives, swelling of the face, eyes, lips, tongue, arms, or legs, trouble swallowing or breathing Fast or irregular heartbeat Gallbladder problems--severe stomach pain, nausea, vomiting, fever Kidney injury--decrease in the amount of urine, swelling of the ankles, hands, or feet Pancreatitis--severe stomach pain that spreads to your back or gets worse after eating or when touched, fever, nausea, vomiting Thoughts of suicide or self-harm, worsening mood, feelings of depression Thyroid cancer--new mass or lump in the neck, pain or trouble swallowing, trouble breathing, hoarseness Side effects that usually do not require medical attention (report to your care team if they continue or are  bothersome): Constipation Dizziness Fatigue Headache Loss of Appetite Nausea Upset stomach This list may not describe all possible side effects. Call your doctor for medical advice about side effects. You may report side effects to FDA at 1-800-FDA-1088. Where should I keep my medication? Keep out of the reach of children and pets. Store unopened pen in a refrigerator between 2 and 8 degrees C (36 and 46 degrees F). Do not freeze or use if the medication has been frozen. Protect from light and excessive heat. After you first use the pen, it can be stored at room temperature between 15 and 30 degrees C (59 and 86 degrees F) or in a refrigerator. Throw away your used pen after 30 days or after the expiration date, whichever comes first. Do not store your pen with the needle attached. If the needle is left on, medication may leak from the pen. NOTE: This sheet is a summary. It may not cover all possible information. If you have questions about this medicine, talk to your doctor, pharmacist, or health care provider.  2023 Elsevier/Gold Standard (2020-12-27 00:00:00)  Zoster Vaccine, Recombinant injection What is this medication? ZOSTER VACCINE (ZOS ter vak SEEN) is a vaccine used to reduce the risk of getting shingles. This vaccine is not used to treat shingles or nerve pain from shingles. This medicine may be used for other purposes; ask your health care provider or pharmacist if you have questions. COMMON BRAND NAME(S): [facility] What should I tell my care team before I take this medication? They need to know if you have any of these conditions: cancer immune system problems an unusual or allergic reaction to Zoster vaccine, other medications, foods, dyes, or preservatives pregnant or trying to get pregnant breast-feeding How should I use this medication? This vaccine is injected into a muscle. It is given by a health care provider. A copy of Vaccine Information Statements will be given  before each vaccination. Be sure to read this information carefully each time. This sheet may change often. Talk to your health care provider about the use of this vaccine in children. This vaccine is not approved for use in children. Overdosage: If you think you have taken too much of this medicine contact a poison control center or emergency room at once. NOTE: This medicine is only for you. Do not share this medicine with others. What if I miss a dose? Keep appointments for follow-up (booster) doses. It is important not to miss your dose. Call your health care provider if you are unable to keep an appointment. What may interact with this medication? medicines that suppress your immune system medicines to treat cancer steroid medicines like  prednisone or cortisone This list may not describe all possible interactions. Give your health care provider a list of all the medicines, herbs, non-prescription drugs, or dietary supplements you use. Also tell them if you smoke, drink alcohol, or use illegal drugs. Some items may interact with your medicine. What should I watch for while using this medication? Visit your health care provider regularly. This vaccine, like all vaccines, may not fully protect everyone. What side effects may I notice from receiving this medication? Side effects that you should report to your doctor or health care professional as soon as possible: allergic reactions (skin rash, itching or hives; swelling of the face, lips, or tongue) trouble breathing Side effects that usually do not require medical attention (report these to your doctor or health care professional if they continue or are bothersome): chills headache fever nausea pain, redness, or irritation at site where injected tiredness vomiting This list may not describe all possible side effects. Call your doctor for medical advice about side effects. You may report side effects to FDA at 1-800-FDA-1088. Where  should I keep my medication? This vaccine is only given by a health care provider. It will not be stored at home. NOTE: This sheet is a summary. It may not cover all possible information. If you have questions about this medicine, talk to your doctor, pharmacist, or health care provider.  2023 Elsevier/Gold Standard (2021-10-24 00:00:00)

## 2022-08-03 ENCOUNTER — Ambulatory Visit: Payer: BC Managed Care – PPO | Admitting: Family

## 2022-08-03 ENCOUNTER — Encounter: Payer: Self-pay | Admitting: Family

## 2022-08-03 VITALS — BP 118/72 | HR 79 | Temp 98.3°F | Ht 65.0 in | Wt 220.3 lb

## 2022-08-03 DIAGNOSIS — L259 Unspecified contact dermatitis, unspecified cause: Secondary | ICD-10-CM

## 2022-08-03 HISTORY — DX: Unspecified contact dermatitis, unspecified cause: L25.9

## 2022-08-03 MED ORDER — TRIAMCINOLONE ACETONIDE 0.5 % EX OINT: 1.0000 | TOPICAL_OINTMENT | Freq: Two times a day (BID) | CUTANEOUS | 1 refills | Status: DC

## 2022-08-03 NOTE — Assessment & Plan Note (Signed)
Symptoms most consistent with a contact dermatitis.  Provided with clobetasol to use temporarily.  Counseled on side effects of atrophy, skin discoloration.  I also provider her with a work note to excuse her from shucking corn.

## 2022-08-03 NOTE — Progress Notes (Signed)
WAS SHUCKING CORN. BROKE OUT ON ARMS LEGS BACK CHEST....NOT SURE IF IT IS THE CORN OR THE SHUCKS BUT SHE CANNOT BE OUTSIDE IN SUN FOR TOO LONG  EITHER?

## 2022-08-03 NOTE — Progress Notes (Signed)
Subjective:    Patient ID: Erica Mccall, female    DOB: June 04, 1971, 51 y.o.   MRN: [id number]  CC: Erica Mccall is a 51 y.o. female who presents today for an acute visit.    HPI: Complains of itching skin , rash x 5 days ago, unchanged. No new lesions Onset after shucking corn which is biologically engineered for science. She wore gloves  to wrists and was outside in the sun.  She hasnt shucked corn for years, she shucked 2 weeks ago a from 830am-230pm.  She had itching rash on bilateral forearms which then resolved on its own after a couple of days. She had second episode when she shucked corn with rash bilateral arms, upper chest, and legs.  No rash in mouth, palms of hands or feet.  No fevers or chills.  She otherwise feels well . no recent antibiotics, or changes to lotions or creams.  She cannot take benadryl due to drowsiness.  She can take claritin.  She works Industrial/product designer     H/o allergic rhinitis due pollen, sometimes dogs.  She is compliant with Flonase, Singulair No h/o asthma HISTORY:  Past Medical History:  Diagnosis Date   Allergy    Anxiety    Anxiety    ASCUS with positive high risk HPV cervical 12/2018   Blood in stool    BV (bacterial vaginosis)    Chicken pox    Endometriosis    Headache    Insomnia    Leiomyoma of uterus    Menorrhagia    Seasonal allergies    UTI (urinary tract infection)    Vitamin D deficiency    Past Surgical History:  Procedure Laterality Date   COLONOSCOPY     ~18 yrs ago    ENDOMETRIAL BIOPSY     ESOPHAGOGASTRODUODENOSCOPY     HYSTEROSCOPY  2004   LEG SURGERY  10/13/2017   s/p fall    ORIF ANKLE FRACTURE Left 10/13/2017   Procedure: OPEN REDUCTION INTERNAL FIXATION (ORIF) ANKLE FRACTURE;  Surgeon: Lovell Sheehan, MD;  Location: [facility] ORS;  Service: Orthopedics;  Laterality: Left;   TOTAL ABDOMINAL HYSTERECTOMY  01/2005   leio, endometriosis; Dr. Ammie Dalton; has ovaries and she thinks cervix   UPPER  GASTROINTESTINAL ENDOSCOPY     Family History  Problem Relation Age of Onset   Breast cancer Mother 34   Hypertension Mother    Berenice Primas' disease Mother    Transient ischemic attack Mother    Aneurysm Mother        x3 brain   Aortic aneurysm Mother    Hypertension Brother    Colon cancer Maternal Grandmother 50   Lung cancer Maternal Grandfather    Prostate cancer Maternal Grandfather    Diabetes Paternal Grandmother    Thyroid disease Maternal Aunt    Breast cancer Paternal Aunt 41   Diabetes Paternal Aunt    Colon polyps Neg Hx    Esophageal cancer Neg Hx    Rectal cancer Neg Hx    Stomach cancer Neg Hx     Allergies: Iodine, Adipex-p [phentermine], Fish allergy, Pollen extract, and Valium [diazepam] Current Outpatient Medications on File Prior to Visit  Medication Sig Dispense Refill   Cholecalciferol (VITAMIN D3) 50000 units CAPS Take 50,000 Units every Thursday by mouth.   2   ciprofloxacin-dexamethasone (CIPRODEX) OTIC suspension Place 4 drops into the left ear 2 (two) times daily. 4-7 days 7.5 mL 0   citalopram (CELEXA) 40 MG tablet  Take 1 tablet (40 mg total) by mouth daily. 90 tablet 3   fluticasone (FLONASE) 50 MCG/ACT nasal spray Place 1-2 sprays into both nostrils daily. 16 mL 12   ibuprofen (ADVIL,MOTRIN) 200 MG tablet Take 400 mg every 6 (six) hours as needed by mouth for headache.     Insulin Pen Needle (NOVOFINE) 30G X 8 MM MISC Inject 10 each into the skin as needed. 90 each 3   Liraglutide -Weight Management (SAXENDA) 18 MG/3ML SOPN Inject 0.6 mg into the skin daily. X 1 week, 1.2 qd x 1 week, 1.8qd x 1 week, 2.4 qd x 1 weej 9 mL 3   montelukast (SINGULAIR) 10 MG tablet Take 1 tablet (10 mg total) by mouth at bedtime. 90 tablet 3   ondansetron (ZOFRAN) 4 MG tablet Take 1 tablet (4 mg total) by mouth every 8 (eight) hours as needed. 40 tablet 0   phentermine (ADIPEX-P) 37.5 MG tablet Take 1 tablet (37.5 mg total) by mouth daily before breakfast. Rx 1/2 60 tablet 0    Probiotic CAPS Take 1 capsule daily by mouth.     temazepam (RESTORIL) 30 MG capsule Qhs prn 30 capsule 5   No current facility-administered medications on file prior to visit.    Social History   Tobacco Use   Smoking status: Never   Smokeless tobacco: Never  Vaping Use   Vaping Use: Never used  Substance Use Topics   Alcohol use: Yes    Comment: occasionally   Drug use: No    Review of Systems  Constitutional:  Negative for chills and fever.  Respiratory:  Negative for cough.   Cardiovascular:  Negative for chest pain and palpitations.  Gastrointestinal:  Negative for nausea and vomiting.  Skin:  Positive for rash.      Objective:    BP 118/72 (BP Location: Left Arm, Patient Position: Sitting, Cuff Size: Normal)   Pulse 79   Temp 98.3 F (36.8 C) (Oral)   Ht '5\' 5"'$  (1.651 m)   Wt 220 lb 4.8 oz (99.9 kg)   LMP  (LMP Unknown)   SpO2 98%   BMI 36.66 kg/m    Physical Exam Vitals reviewed.  Constitutional:      Appearance: She is well-developed.  Eyes:     Conjunctiva/sclera: Conjunctivae normal.  Cardiovascular:     Rate and Rhythm: Normal rate and regular rhythm.     Pulses: Normal pulses.     Heart sounds: Normal heart sounds.  Pulmonary:     Effort: Pulmonary effort is normal.     Breath sounds: Normal breath sounds. No wheezing, rhonchi or rales.  Skin:    General: Skin is warm and dry.          Comments: Pin point sized, scattered discrete papular lesions noted bilateral flexural folds and chest.  No vesicular lesions, red streaks, increased warmth or purulent discharge.  Neurological:     Mental Status: She is alert.  Psychiatric:        Speech: Speech normal.        Behavior: Behavior normal.        Thought Content: Thought content normal.        Assessment & Plan:   Problem List Items Addressed This Visit       Musculoskeletal and Integument   Contact dermatitis - Primary    Symptoms most consistent with a contact dermatitis.   Provided with clobetasol to use temporarily.  Counseled on side effects of atrophy, skin discoloration.  I also provider her with a work note to excuse her from shucking corn.        Relevant Medications   triamcinolone ointment (KENALOG) 0.5 %      I am having Erica Mccall start on triamcinolone ointment. I am also having her maintain her Vitamin D3, Probiotic, ibuprofen, fluticasone, Saxenda, Insulin Pen Needle, montelukast, citalopram, temazepam, phentermine, ondansetron, and ciprofloxacin-dexamethasone.   Meds ordered this encounter  Medications   triamcinolone ointment (KENALOG) 0.5 %    Sig: Apply 1 Application topically 2 (two) times daily. External use only    Dispense:  15 g    Refill:  1    Order Specific Question:   Supervising Provider    Answer:   Crecencio Mc [[id number]]    Return precautions given.   Risks, benefits, and alternatives of the medications and treatment plan prescribed today were discussed, and patient expressed understanding.   Education regarding symptom management and diagnosis given to patient on AVS.  Continue to follow with McLean-Scocuzza, Nino Glow, MD for routine health maintenance.   Erica Mccall and I agreed with plan.   Mable Paris, FNP

## 2022-08-03 NOTE — Patient Instructions (Signed)
Trial of Kenalog which is a topical steroid for your arms legs and chest.  Please only use for several days as we discussed topical steroids can cause atrophy of the skin, skin discoloration.  Please let me know how you are doing  Contact Dermatitis Dermatitis is redness, soreness, and swelling (inflammation) of the skin. Contact dermatitis is a reaction to certain substances that touch the skin. Many different substances can cause contact dermatitis. There are two types of contact dermatitis: Irritant contact dermatitis. This type is caused by something that irritates your skin, such as having dry hands from washing them too often with soap. This type does not require previous exposure to the substance for a reaction to occur. This is the most common type. Allergic contact dermatitis. This type is caused by a substance that you are allergic to, such as poison ivy. This type occurs when you have been exposed to the substance (allergen) and develop a sensitivity to it. Dermatitis may develop soon after your first exposure to the allergen, or it may not develop until the next time you are exposed and every time thereafter. What are the causes? Irritant contact dermatitis is most commonly caused by exposure to: Makeup. Soaps. Detergents. Bleaches. Acids. Metal salts, such as nickel. Allergic contact dermatitis is most commonly caused by exposure to: Poisonous plants. Chemicals. Jewelry. Latex. Medicines. Preservatives in products, such as clothing. What increases the risk? You are more likely to develop this condition if you have: A job that exposes you to irritants or allergens. Certain medical conditions, such as asthma or eczema. What are the signs or symptoms? Symptoms of this condition may occur on your body anywhere the irritant has touched you or is touched by you. Symptoms include: Dryness or flaking. Redness. Cracks. Itching. Pain or a burning feeling. Blisters. Drainage of  small amounts of blood or clear fluid from skin cracks. With allergic contact dermatitis, there may also be swelling in areas such as the eyelids, mouth, or genitals. How is this diagnosed? This condition is diagnosed with a medical history and physical exam. A patch skin test may be performed to help determine the cause. If the condition is related to your job, you may need to see an occupational medicine specialist. How is this treated? This condition is treated by checking for the cause of the reaction and protecting your skin from further contact. Treatment may also include: Steroid creams or ointments. Oral steroid medicines may be needed in more severe cases. Antibiotic medicines or antibacterial ointments, if a skin infection is present. Antihistamine lotion or an antihistamine taken by mouth to ease itching. A bandage (dressing). Follow these instructions at home: Skin care Moisturize your skin as needed. Apply cool compresses to the affected areas. Try applying baking soda paste to your skin. Stir water into baking soda until it reaches a paste-like consistency. Do not scratch your skin, and avoid friction to the affected area. Avoid the use of soaps, perfumes, and dyes. Medicines Take or apply over-the-counter and prescription medicines only as told by your health care provider. If you were prescribed an antibiotic medicine, take or apply the antibiotic as told by your health care provider. Do not stop using the antibiotic even if your condition improves. Bathing Try taking a bath with: Epsom salts. Follow the instructions on the packaging. You can get these at your local pharmacy or grocery store. Baking soda. Pour a small amount into the bath as directed by your health care provider. Colloidal oatmeal. Follow the  instructions on the packaging. You can get this at your local pharmacy or grocery store. Bathe less frequently, such as every other day. Bathe in lukewarm water.  Avoid using hot water. Bandage care If you were given a bandage (dressing), change it as told by your health care provider. Wash your hands with soap and water before and after you change your dressing. If soap and water are not available, use hand sanitizer. General instructions Avoid the substance that caused your reaction. If you do not know what caused it, keep a journal to try to track what caused it. Write down: What you eat. What cosmetic products you use. What you drink. What you wear in the affected area. This includes jewelry. Check the affected areas every day for signs of infection. Check for: More redness, swelling, or pain. More fluid or blood. Warmth. Pus or a bad smell. Keep all follow-up visits as told by your health care provider. This is important. Contact a health care provider if: Your condition does not improve with treatment. Your condition gets worse. You have signs of infection such as swelling, tenderness, redness, soreness, or warmth in the affected area. You have a fever. You have new symptoms. Get help right away if: You have a severe headache, neck pain, or neck stiffness. You vomit. You feel very sleepy. You notice red streaks coming from the affected area. Your bone or joint underneath the affected area becomes painful after the skin has healed. The affected area turns darker. You have difficulty breathing. Summary Dermatitis is redness, soreness, and swelling (inflammation) of the skin. Contact dermatitis is a reaction to certain substances that touch the skin. Symptoms of this condition may occur on your body anywhere the irritant has touched you or is touched by you. This condition is treated by figuring out what caused the reaction and protecting your skin from further contact. Treatment may also include medicines and skin care. Avoid the substance that caused your reaction. If you do not know what caused it, keep a journal to try to track what  caused it. Contact a health care provider if your condition gets worse or you have signs of infection such as swelling, tenderness, redness, soreness, or warmth in the affected area. This information is not intended to replace advice given to you by your health care provider. Make sure you discuss any questions you have with your health care provider. Document Revised: 09/08/2021 Document Reviewed: 09/08/2021 Elsevier Patient Education  [place].

## 2022-08-25 ENCOUNTER — Other Ambulatory Visit: Payer: Self-pay | Admitting: Internal Medicine

## 2022-08-25 DIAGNOSIS — Z6838 Body mass index (BMI) 38.0-38.9, adult: Secondary | ICD-10-CM

## 2022-08-25 DIAGNOSIS — E669 Obesity, unspecified: Secondary | ICD-10-CM

## 2022-09-15 ENCOUNTER — Ambulatory Visit: Payer: BC Managed Care – PPO | Admitting: Family Medicine

## 2022-09-21 ENCOUNTER — Ambulatory Visit: Payer: BC Managed Care – PPO | Admitting: Family Medicine

## 2022-09-21 ENCOUNTER — Telehealth: Payer: Self-pay

## 2022-09-21 ENCOUNTER — Encounter: Payer: Self-pay | Admitting: Family Medicine

## 2022-09-21 VITALS — BP 114/76 | HR 66 | Temp 98.0°F | Ht 65.0 in | Wt 218.0 lb

## 2022-09-21 DIAGNOSIS — R002 Palpitations: Secondary | ICD-10-CM | POA: Diagnosis not present

## 2022-09-21 DIAGNOSIS — G4700 Insomnia, unspecified: Secondary | ICD-10-CM | POA: Diagnosis not present

## 2022-09-21 DIAGNOSIS — F419 Anxiety disorder, unspecified: Secondary | ICD-10-CM

## 2022-09-21 DIAGNOSIS — F32A Depression, unspecified: Secondary | ICD-10-CM

## 2022-09-21 MED ORDER — CITALOPRAM HYDROBROMIDE 20 MG PO TABS: 20.0000 mg | ORAL_TABLET | Freq: Every day | ORAL | 0 refills | Status: DC

## 2022-09-21 MED ORDER — HYDROXYZINE HCL 10 MG PO TABS: 5.0000 mg | ORAL_TABLET | Freq: Three times a day (TID) | ORAL | 0 refills | Status: DC | PRN

## 2022-09-21 MED ORDER — FLUOXETINE HCL 10 MG PO TABS: 10.0000 mg | ORAL_TABLET | Freq: Every day | ORAL | 0 refills | Status: DC

## 2022-09-21 NOTE — Progress Notes (Unsigned)
    SUBJECTIVE:   CHIEF COMPLAINT / HPI: anxiety  Patient reports having severe anxiety attack 2 days ago.  Felt like heart was beating out of chest, had been trembling and voice was quivering.  Flet like she could not calm herself down.  No LOC, seizure activity, headaches, shortness of breath or chest pain.    ***  PERTINENT  PMH / PSH: ***  OBJECTIVE:   BP 114/76 (BP Location: Left Arm, Patient Position: Sitting, Cuff Size: Normal)   Pulse 66   Temp 98 F (36.7 C) (Oral)   Ht '5\' 5"'$  (1.651 m)   Wt 218 lb (98.9 kg)   LMP  (LMP Unknown)   SpO2 99%   BMI 36.28 kg/m    General: Alert, no acute distress Cardio: Normal S1 and S2, RRR, no r/m/g Pulm: CTAB, normal work of breathing Abdomen: Bowel sounds normal. Abdomen soft and non-tender.  Extremities: No peripheral edema.  Neuro: Cranial nerves grossly intact   ASSESSMENT/PLAN:   No problem-specific Assessment & Plan notes found for this encounter.     Carollee Leitz, MD

## 2022-09-21 NOTE — Telephone Encounter (Signed)
Patient states on 09/19/2022, she experienced rapid heart rate, which lasted for a while.  Patient states for the last few weeks she has had little anxiety attacks during which she experienced rapid heart rate.  Patient states she still doesn't feel right from her anxiety attack on 09/19/2022.  Patient states she did not go to an urgent care.  I transferred call to Access Nurse.

## 2022-09-21 NOTE — Telephone Encounter (Signed)
Patient is scheduled for today-09/21/22 at 4:00 with Dr. Volanda Napoleon

## 2022-09-21 NOTE — Patient Instructions (Signed)
It was a pleasure meeting you today. Thank you for allowing me to take part in your health care.  Our goals for today as we discussed include:  For your anxiety Decrease Celexa to 20 mg daily Start Prozac 10 mg daily Start Atarax 5 mg at night.  Do not take with Restoril. You can increase to 5 mg twice a day if needed if tolerated after 1-2 days.  If needed can increase to 10 mg three times a day.  Please return tomorrow morning 10/17 at 1100 am for blood work  Your ECG was normal  Stop Phentermine Stop Zofran  As discussed will plan to decrease Temazepam in future and discuss other options to help with sleep  Recommend scheduling appointment with therapist.   Can check website [email or web address]    For [place] [phone] Address: [place], [place] [address] Hours: Open 24/7, No appointment required.      Please follow-up with PCP in 1 week  If you have any questions or concerns, please do not hesitate to call the office at [phone].  I look forward to our next visit and until then take care and stay safe.  Regards,   Carollee Leitz, MD   [facility]

## 2022-09-22 ENCOUNTER — Other Ambulatory Visit (INDEPENDENT_AMBULATORY_CARE_PROVIDER_SITE_OTHER): Payer: BC Managed Care – PPO

## 2022-09-22 DIAGNOSIS — R002 Palpitations: Secondary | ICD-10-CM

## 2022-09-22 LAB — COMPREHENSIVE METABOLIC PANEL
ALT: 15 U/L (ref 0–35)
AST: 16 U/L (ref 0–37)
Albumin: 4 g/dL (ref 3.5–5.2)
Alkaline Phosphatase: 97 U/L (ref 39–117)
BUN: 13 mg/dL (ref 6–23)
CO2: 27 mEq/L (ref 19–32)
Calcium: 8.9 mg/dL (ref 8.4–10.5)
Chloride: 104 mEq/L (ref 96–112)
Creatinine, Ser: 0.83 mg/dL (ref 0.40–1.20)
GFR: 81.77 mL/min (ref >60.00)
Glucose, Bld: 86 mg/dL (ref 70–99)
Potassium: 3.8 mEq/L (ref 3.5–5.1)
Sodium: 138 mEq/L (ref 135–145)
Total Bilirubin: 0.4 mg/dL (ref 0.2–1.2)
Total Protein: 6.9 g/dL (ref 6.0–8.3)

## 2022-09-22 LAB — CBC
HCT: 38.1 % (ref 36.0–46.0)
Hemoglobin: 12.6 g/dL (ref 12.0–15.0)
MCHC: 33.1 g/dL (ref 30.0–36.0)
MCV: 94.1 fl (ref 78.0–100.0)
Platelets: 241 10*3/uL (ref 150.0–400.0)
RBC: 4.04 Mil/uL (ref 3.87–5.11)
RDW: 12.9 % (ref 11.5–15.5)
WBC: 4 10*3/uL (ref 4.0–10.5)

## 2022-09-22 LAB — TSH: TSH: 0.79 u[IU]/mL (ref 0.35–5.50)

## 2022-09-27 ENCOUNTER — Encounter: Payer: Self-pay | Admitting: Family Medicine

## 2022-09-27 NOTE — Assessment & Plan Note (Addendum)
Increasing stress at home possibly triggered by eldest son incarceration.    -Given heart palpitations will obtain ECG for baseline.   [facility] shows NSR without ST elevation/depression -TSH, CBC and CMet today to r/o electrolytes abnormalities, anemia or metabolic etiologies -Decrease Celexa to 20 mg daily x 2 weeks, plan to wean off -Start Fluoxetine 10 mg daily, plan to titrate to effective dose -Start Atarax 5 mg TID as needed, can increase to 10 mg TID -Mental health resources provided -Recommend therapy for counseling  -Follow up in 1 week

## 2022-09-27 NOTE — Assessment & Plan Note (Signed)
Taking Temazepam 30 mg x 3-5 yrs.  Discussed with patient increased risk of medication associated with cognitive decline with longterm use.  Patient ok to wean medication.  Had just picked up medications. -Plan to decrease to 15 mg at next refill.  Will continue to wean to off -Could consider Trazodone or increasing Atarax to help with sleep. -Encourage therapy for counseling  -Follow up in 1 week

## 2022-09-28 ENCOUNTER — Encounter: Payer: Self-pay | Admitting: Family Medicine

## 2022-09-28 ENCOUNTER — Ambulatory Visit: Payer: BC Managed Care – PPO | Admitting: Family Medicine

## 2022-09-28 VITALS — BP 122/84 | HR 76 | Temp 98.2°F | Ht 65.0 in | Wt 215.4 lb

## 2022-09-28 DIAGNOSIS — F32A Depression, unspecified: Secondary | ICD-10-CM

## 2022-09-28 DIAGNOSIS — F419 Anxiety disorder, unspecified: Secondary | ICD-10-CM

## 2022-09-28 DIAGNOSIS — Z23 Encounter for immunization: Secondary | ICD-10-CM

## 2022-09-28 NOTE — Progress Notes (Unsigned)
    SUBJECTIVE:   CHIEF COMPLAINT / HPI: follow up anxiety  Presents for follow up for acute anxiety. Seen in clinic on 10/16 and Celexa was decreased to 20 mg with the addition of Fluoxetine 10 mg daily.  Since then patient reports improvement in symptoms. Feeling back to baseline. Able to function at home and at work.  Has not had any heart palpitations since starting medications. Denies a ny SI/HI  PERTINENT  PMH / PSH:  Anxiety  OBJECTIVE:   BP 122/84 (BP Location: Left Arm, Patient Position: Sitting, Cuff Size: Normal)   Pulse 76   Temp 98.2 F (36.8 C) (Oral)   Ht '5\' 5"'$  (1.651 m)   Wt 215 lb 6.4 oz (97.7 kg)   LMP  (LMP Unknown)   SpO2 98%   BMI 35.84 kg/m    General: Alert, no acute distress Cardio: Normal S1 and S2, RRR, no r/m/g Pulm: CTAB, normal work of breathing    09/28/2022    4:05 PM 09/21/2022    4:16 PM 08/03/2022   11:04 AM 06/24/2022   11:28 AM 11/20/2021    3:23 PM  Depression screen PHQ 2/9  Decreased Interest 0 0 0 0 0  Down, Depressed, Hopeless 0 0 0 0 0  PHQ - 2 Score 0 0 0 0 0  Altered sleeping 0  0  0  Tired, decreased energy 0  0  0  Change in appetite 0  0  0  Feeling bad or failure about yourself  0  0  0  Trouble concentrating 0  0  0  Moving slowly or fidgety/restless 0  0  0  Suicidal thoughts 0  0  0  PHQ-9 Score 0  0  0  Difficult doing work/chores Not difficult at all  Not difficult at all  Not difficult at all       09/28/2022    4:21 PM 08/03/2022   11:04 AM 11/20/2020    7:59 AM  GAD 7 : Generalized Anxiety Score  Nervous, Anxious, on Edge 0 0 1  Control/stop worrying 0 0 0  Worry too much - different things 0 0 1  Trouble relaxing 0 0 0  Restless 0 0 0  Easily annoyed or irritable 0 0 0  Afraid - awful might happen 0 0 0  Total GAD 7 Score 0 0 2  Anxiety Difficulty Not difficult at all Not difficult at all Not difficult at all        ASSESSMENT/PLAN:   Anxiety and depression Chronic.  Improving with change in  medication.  Denies any SI/HI -Continue Fluoxetine 10 mg daily, may need to adjust dose in future -Continue Celexa 20 mg x 7 days, then decrease to 10 mg x 14 days, then 10 mg every other day until weaned off -Continue Atarax 0.5 mg TID prn -Encourage therapy for stress management -Follow up in 3 weeks -Strict return precautions provided   HCM Second dose Shingles vaccine received today Flu vaccine today  Carollee Leitz, MD

## 2022-09-28 NOTE — Patient Instructions (Addendum)
It was a pleasure meeting you today. Thank you for allowing me to take part in your health care.  Our goals for today as we discussed include:  I'm happy you are feeling better.  For your anxiety Continue Celexa 20 mg daily. On Oct 31 decrease to 10 mg for 7 days, then 10 mg every other day and continue to increase days until end of 4 weeks.   Continue Prozac 10 mg daily.    Recommend to use Temazepam at night sparingly.  As we discussed will try to wean to lowest effective dose possible in the new year and trial different medication at that time.   For [place] [phone] Address: [place], [place] [address] Hours: Open 24/7, No appointment required.    You have received the Flu vaccine today You have received the Second Shingles vaccine today  Please follow-up with PCP in 3 weeks.  Can be virtual visit  If you have any questions or concerns, please do not hesitate to call the office at [phone].  I look forward to our next visit and until then take care and stay safe.  Regards,   Carollee Leitz, MD   [facility]

## 2022-09-29 ENCOUNTER — Encounter: Payer: Self-pay | Admitting: Family Medicine

## 2022-10-04 ENCOUNTER — Encounter: Payer: Self-pay | Admitting: Family Medicine

## 2022-10-04 NOTE — Assessment & Plan Note (Signed)
Chronic.  Improving with change in medication.  Denies any SI/HI -Continue Fluoxetine 10 mg daily, may need to adjust dose in future -Continue Celexa 20 mg x 7 days, then decrease to 10 mg x 14 days, then 10 mg every other day until weaned off -Continue Atarax 0.5 mg TID prn -Encourage therapy for stress management -Follow up in 3 weeks -Strict return precautions provided

## 2022-10-14 ENCOUNTER — Encounter: Payer: Self-pay | Admitting: Family Medicine

## 2022-10-14 ENCOUNTER — Ambulatory Visit: Payer: BC Managed Care – PPO | Admitting: Family Medicine

## 2022-10-14 VITALS — BP 116/76 | HR 86 | Temp 98.0°F | Ht 65.0 in | Wt 216.6 lb

## 2022-10-14 DIAGNOSIS — F32A Depression, unspecified: Secondary | ICD-10-CM

## 2022-10-14 DIAGNOSIS — F339 Major depressive disorder, recurrent, unspecified: Secondary | ICD-10-CM

## 2022-10-14 DIAGNOSIS — E669 Obesity, unspecified: Secondary | ICD-10-CM

## 2022-10-14 DIAGNOSIS — F419 Anxiety disorder, unspecified: Secondary | ICD-10-CM

## 2022-10-14 DIAGNOSIS — F418 Other specified anxiety disorders: Secondary | ICD-10-CM | POA: Diagnosis not present

## 2022-10-14 MED ORDER — FLUOXETINE HCL 20 MG PO TABS: 20.0000 mg | ORAL_TABLET | Freq: Every day | ORAL | 3 refills | Status: DC

## 2022-10-14 NOTE — Patient Instructions (Addendum)
It was a pleasure meeting you today. Thank you for allowing me to take part in your health care.  Our goals for today as we discussed include:  For your mood Stop Celexa Increase Fluoxetine to 20 mg daily MyChart me in 1-2 weeks to check in and let me know how you are doing  For your weight management Will get labs at your next visit as scheduled.  Please fast for 12 hours prior to blood work Plan to [employer] and fluid intake for at least 24-48 hours. This will help guide a plan for you   Please follow-up with PCP inn Dec as schedule  If you have any questions or concerns, please do not hesitate to call the office at [phone].  I look forward to our next visit and until then take care and stay safe.  Regards,   Carollee Leitz, MD   [facility]

## 2022-10-14 NOTE — Progress Notes (Signed)
    SUBJECTIVE:   CHIEF COMPLAINT / HPI: transfer of care  Presents to clinic for transfer of care  No acute concerns  Anxiety Has significantly improved with switching SSRI.  Feels more like herself. Still on Celexa.  Takes last dose in 3 days.  Would like to increase Fluoxetine today/  Denies any SI/HI  Weight management Would like to start medication for weight loss.  Has not tried to diet and exercise.  Feels like craves food at night.     PERTINENT  PMH / PSH:  Anxiety  OBJECTIVE:   BP 116/76 (BP Location: Left Arm, Patient Position: Sitting, Cuff Size: Normal)   Pulse 86   Temp 98 F (36.7 C) (Oral)   Ht '5\' 5"'$  (1.651 m)   Wt 216 lb 9.6 oz (98.2 kg)   LMP  (LMP Unknown)   SpO2 97%   BMI 36.04 kg/m    General: Alert, no acute distress Cardio: Normal S1 and S2, RRR, no r/m/g Pulm: CTAB, normal work of breathing Abdomen: Bowel sounds normal. Abdomen soft and non-tender.  Extremities: No peripheral edema.  Neuro: Cranial nerves grossly intact    10/14/2022    9:45 AM 09/28/2022    4:05 PM 09/21/2022    4:16 PM 08/03/2022   11:04 AM 06/24/2022   11:28 AM  Depression screen PHQ 2/9  Decreased Interest 0 0 0 0 0  Down, Depressed, Hopeless 0 0 0 0 0  PHQ - 2 Score 0 0 0 0 0  Altered sleeping 0 0  0   Tired, decreased energy 0 0  0   Change in appetite 0 0  0   Feeling bad or failure about yourself  0 0  0   Trouble concentrating 0 0  0   Moving slowly or fidgety/restless 0 0  0   Suicidal thoughts 0 0  0   PHQ-9 Score 0 0  0   Difficult doing work/chores Not difficult at all Not difficult at all  Not difficult at all        10/14/2022    9:45 AM 09/28/2022    4:21 PM 08/03/2022   11:04 AM 11/20/2020    7:59 AM  GAD 7 : Generalized Anxiety Score  Nervous, Anxious, on Edge 0 0 0 1  Control/stop worrying 0 0 0 0  Worry too much - different things 0 0 0 1  Trouble relaxing 0 0 0 0  Restless 0 0 0 0  Easily annoyed or irritable 0 0 0 0  Afraid - awful might  happen 0 0 0 0  Total GAD 7 Score 0 0 0 2  Anxiety Difficulty Not difficult at all Not difficult at all Not difficult at all Not difficult at all      ASSESSMENT/PLAN:   Depression, recurrent (HCC) PHQ9 negative Increasing Prozac to 20 mg daily  Stop Celexa Follow up in 4 weeks Strict return precautions provided  Anxiety and depression GAD7 and PHQ9 negative Stop Celexa Increase Fluoxetine to 20 mg daily Encouraged CBT Follow up in 4 weeks Strict return precautions provided     Carollee Leitz, MD

## 2022-10-19 ENCOUNTER — Telehealth: Payer: BC Managed Care – PPO | Admitting: Family Medicine

## 2022-10-25 ENCOUNTER — Encounter: Payer: Self-pay | Admitting: Family Medicine

## 2022-10-25 NOTE — Assessment & Plan Note (Signed)
PHQ9 negative Increasing Prozac to 20 mg daily  Stop Celexa Follow up in 4 weeks Strict return precautions provided

## 2022-10-25 NOTE — Assessment & Plan Note (Signed)
GAD7 and PHQ9 negative Stop Celexa Increase Fluoxetine to 20 mg daily Encouraged CBT Follow up in 4 weeks Strict return precautions provided

## 2022-11-06 ENCOUNTER — Other Ambulatory Visit (INDEPENDENT_AMBULATORY_CARE_PROVIDER_SITE_OTHER): Payer: BC Managed Care – PPO

## 2022-11-06 DIAGNOSIS — E669 Obesity, unspecified: Secondary | ICD-10-CM | POA: Diagnosis not present

## 2022-11-06 LAB — COMPREHENSIVE METABOLIC PANEL
ALT: 35 U/L (ref 0–35)
AST: 31 U/L (ref 0–37)
Albumin: 4.1 g/dL (ref 3.5–5.2)
Alkaline Phosphatase: 103 U/L (ref 39–117)
BUN: 12 mg/dL (ref 6–23)
CO2: 28 mEq/L (ref 19–32)
Calcium: 8.7 mg/dL (ref 8.4–10.5)
Chloride: 104 mEq/L (ref 96–112)
Creatinine, Ser: 0.86 mg/dL (ref 0.40–1.20)
GFR: 78.29 mL/min (ref >60.00)
Glucose, Bld: 67 mg/dL — ABNORMAL LOW (ref 70–99)
Potassium: 3.5 mEq/L (ref 3.5–5.1)
Sodium: 139 mEq/L (ref 135–145)
Total Bilirubin: 0.4 mg/dL (ref 0.2–1.2)
Total Protein: 7.2 g/dL (ref 6.0–8.3)

## 2022-11-06 LAB — LIPID PANEL
Cholesterol: 155 mg/dL (ref 0–200)
HDL: 41.6 mg/dL (ref >39.00)
LDL Cholesterol: 97 mg/dL (ref 0–99)
NonHDL: 113.43
Total CHOL/HDL Ratio: 4
Triglycerides: 81 mg/dL (ref 0.0–149.0)
VLDL: 16.2 mg/dL (ref 0.0–40.0)

## 2022-11-06 LAB — CBC WITH DIFFERENTIAL/PLATELET
Basophils Absolute: 0 10*3/uL (ref 0.0–0.1)
Basophils Relative: 0.7 % (ref 0.0–3.0)
Eosinophils Absolute: 0.1 10*3/uL (ref 0.0–0.7)
Eosinophils Relative: 5 % (ref 0.0–5.0)
HCT: 38.3 % (ref 36.0–46.0)
Hemoglobin: 12.9 g/dL (ref 12.0–15.0)
Lymphocytes Relative: 43.5 % (ref 12.0–46.0)
Lymphs Abs: 1.3 10*3/uL (ref 0.7–4.0)
MCHC: 33.8 g/dL (ref 30.0–36.0)
MCV: 93.1 fl (ref 78.0–100.0)
Monocytes Absolute: 0.4 10*3/uL (ref 0.1–1.0)
Monocytes Relative: 11.8 % (ref 3.0–12.0)
Neutro Abs: 1.2 10*3/uL — ABNORMAL LOW (ref 1.4–7.7)
Neutrophils Relative %: 39 % — ABNORMAL LOW (ref 43.0–77.0)
Platelets: 220 10*3/uL (ref 150.0–400.0)
RBC: 4.11 Mil/uL (ref 3.87–5.11)
RDW: 12.6 % (ref 11.5–15.5)
WBC: 3 10*3/uL — ABNORMAL LOW (ref 4.0–10.5)

## 2022-11-06 LAB — VITAMIN D 25 HYDROXY (VIT D DEFICIENCY, FRACTURES): VITD: 55.3 ng/mL (ref 30.00–100.00)

## 2022-11-06 LAB — VITAMIN B12: Vitamin B-12: >1500 pg/mL — ABNORMAL HIGH (ref 211–911)

## 2022-11-06 LAB — HEMOGLOBIN A1C: Hgb A1c MFr Bld: 5.5 % (ref 4.6–6.5)

## 2022-11-06 LAB — TSH: TSH: 1.31 u[IU]/mL (ref 0.35–5.50)

## 2022-11-17 ENCOUNTER — Ambulatory Visit: Payer: BC Managed Care – PPO

## 2022-11-24 ENCOUNTER — Ambulatory Visit (INDEPENDENT_AMBULATORY_CARE_PROVIDER_SITE_OTHER): Payer: BC Managed Care – PPO | Admitting: Family Medicine

## 2022-11-24 ENCOUNTER — Encounter: Payer: Self-pay | Admitting: Family Medicine

## 2022-11-24 VITALS — BP 128/82 | HR 68 | Temp 97.9°F | Ht 65.0 in | Wt 208.6 lb

## 2022-11-24 DIAGNOSIS — E669 Obesity, unspecified: Secondary | ICD-10-CM | POA: Diagnosis not present

## 2022-11-24 DIAGNOSIS — E876 Hypokalemia: Secondary | ICD-10-CM | POA: Diagnosis not present

## 2022-11-24 DIAGNOSIS — R7309 Other abnormal glucose: Secondary | ICD-10-CM

## 2022-11-24 DIAGNOSIS — F39 Unspecified mood [affective] disorder: Secondary | ICD-10-CM

## 2022-11-24 DIAGNOSIS — Z23 Encounter for immunization: Secondary | ICD-10-CM

## 2022-11-24 DIAGNOSIS — Z0000 Encounter for general adult medical examination without abnormal findings: Secondary | ICD-10-CM | POA: Diagnosis not present

## 2022-11-24 DIAGNOSIS — E785 Hyperlipidemia, unspecified: Secondary | ICD-10-CM | POA: Diagnosis not present

## 2022-11-24 LAB — BASIC METABOLIC PANEL
BUN: 14 mg/dL (ref 6–23)
CO2: 26 mEq/L (ref 19–32)
Calcium: 9.1 mg/dL (ref 8.4–10.5)
Chloride: 104 mEq/L (ref 96–112)
Creatinine, Ser: 0.88 mg/dL (ref 0.40–1.20)
GFR: 76.13 mL/min (ref >60.00)
Glucose, Bld: 76 mg/dL (ref 70–99)
Potassium: 3.9 mEq/L (ref 3.5–5.1)
Sodium: 138 mEq/L (ref 135–145)

## 2022-11-24 NOTE — Assessment & Plan Note (Signed)
Healthy 51 year old female.  Normotensive Colonoscopy up-to-date Mammogram up-to-date Pap smear up-to-date Shingles up-to-date Tdap today Continue healthy lifestyle choices

## 2022-11-24 NOTE — Progress Notes (Signed)
SUBJECTIVE:   Chief Complaint  Patient presents with   Annual Exam   HPI Patient presents for annual physical.  No acute concerns today.  Mood disorder Doing well with increase in Prozac.  Now focusing more on herself and staying positive.  She has lost 8 pounds since her last visit.  Making healthier choices.  Mood has been good, she is happier and more interested in getting out and doing things.  Colonoscopy up-to-date Mammogram up-to-date Pap smear up-to-date, follows with OB/GYN.  PERTINENT PMH / PSH: Mood disorder PTSD Hyperlipidemia Obesity class II   OBJECTIVE:  BP 128/82   Pulse 68   Temp 97.9 F (36.6 C)   Ht '5\' 5"'$  (1.651 m)   Wt 208 lb 9.6 oz (94.6 kg)   LMP  (LMP Unknown)   SpO2 99%   BMI 34.71 kg/m    Physical Exam Vitals reviewed.  Constitutional:      General: She is not in acute distress.    Appearance: Normal appearance. She is obese. She is not ill-appearing or toxic-appearing.  HENT:     Head: Normocephalic.     Right Ear: Tympanic membrane, ear canal and external ear normal.     Left Ear: Tympanic membrane, ear canal and external ear normal.     Nose: Nose normal.     Mouth/Throat:     Mouth: Mucous membranes are moist.  Eyes:     Extraocular Movements: Extraocular movements intact.     Conjunctiva/sclera: Conjunctivae normal.     Pupils: Pupils are equal, round, and reactive to light.  Neck:     Thyroid: No thyroid mass, thyromegaly or thyroid tenderness.     Vascular: No carotid bruit.  Cardiovascular:     Rate and Rhythm: Normal rate and regular rhythm.     Pulses: Normal pulses.     Heart sounds: Normal heart sounds.  Pulmonary:     Effort: Pulmonary effort is normal.     Breath sounds: Normal breath sounds.  Abdominal:     General: Bowel sounds are normal.     Palpations: Abdomen is soft.  Musculoskeletal:        General: Normal range of motion.     Cervical back: Full passive range of motion without pain, normal range of  motion and neck supple.     Right lower leg: No edema.     Left lower leg: No edema.  Lymphadenopathy:     Cervical: No cervical adenopathy.     Right cervical: No superficial, deep or posterior cervical adenopathy.    Left cervical: No superficial, deep or posterior cervical adenopathy.  Skin:    General: Skin is warm and dry.     Capillary Refill: Capillary refill takes less than 2 seconds.  Neurological:     General: No focal deficit present.     Mental Status: She is alert and oriented to person, place, and time. Mental status is at baseline.  Psychiatric:        Mood and Affect: Mood normal.        Behavior: Behavior normal.        Thought Content: Thought content normal.        Judgment: Judgment normal.     ASSESSMENT/PLAN:  Annual physical exam Assessment & Plan: Healthy 51 year old female.  Normotensive Colonoscopy up-to-date Mammogram up-to-date Pap smear up-to-date Shingles up-to-date Tdap today Continue healthy lifestyle choices    Hypokalemia  Hyperlipidemia, unspecified hyperlipidemia type -     Lipid panel;  Future  Obesity (BMI 35.0-39.9 without comorbidity) -     Comprehensive metabolic panel; Future -     Hemoglobin A1c; Future -     CBC with Differential/Platelet; Future -     TSH; Future -     VITAMIN D 25 Hydroxy (Vit-D Deficiency, Fractures); Future -     Vitamin B12; Future  Mood disorder (HCC) Assessment & Plan: Doing well with increased Prozac dosage.  Denies any SI/HI. Negative PHQ-9/GAD 7 screening Continue Prozac 20 mg daily Follow-up as needed.    Encounter for administration of vaccine -     Tdap vaccine greater than or equal to 7yo IM  Abnormal glucose Assessment & Plan: Noted on recent serum labs.  Serum glucose 67.  Currently asymptomatic.  Not diabetic   Orders: -     Basic metabolic panel   PDMP reviewed  Return in about 1 year (around 11/25/2023) for annual visit with fasting labs 1 week prior.  Carollee Leitz,  MD

## 2022-11-24 NOTE — Assessment & Plan Note (Signed)
Doing well with increased Prozac dosage.  Denies any SI/HI. Negative PHQ-9/GAD 7 screening Continue Prozac 20 mg daily Follow-up as needed.

## 2022-11-24 NOTE — Assessment & Plan Note (Signed)
Noted on recent serum labs.  Serum glucose 67.  Currently asymptomatic.  Not diabetic

## 2022-11-24 NOTE — Patient Instructions (Addendum)
It was a pleasure meeting you today. Thank you for allowing me to take part in your health care.  Our goals for today as we discussed include:  I am happy you are doing well and staying positive.  You have lost 8 pounds since your last visit.  Continue your healthy and happy lifestyle journey.  Continue Prozac 20 mg daily  Decreased room temperature by 1 to 2 degrees to help with hot flashes.  Recommend you trial 1 night without using temazepam.  If successful can continue to use intermittently. As discussed will plan to decrease  Temazepam dosing to 15 mg at next prescription refill.  Checking your potassium and glucose today  Your received your Tetanus booster today.    Mammogram due 2024  OB/GYN for your Pap that is due in 2025  Colonoscopy due 2032  Please schedule fasting lab appointment 1 week prior to your next annual visit.  If you have any questions or concerns, please do not hesitate to call the office at [phone].  I look forward to our next visit and until then take care and stay safe.  Regards,   Carollee Leitz, MD   [facility]

## 2022-12-01 ENCOUNTER — Ambulatory Visit: Payer: BC Managed Care – PPO

## 2022-12-09 ENCOUNTER — Other Ambulatory Visit: Payer: Self-pay | Admitting: Family Medicine

## 2022-12-09 DIAGNOSIS — Z1231 Encounter for screening mammogram for malignant neoplasm of breast: Secondary | ICD-10-CM

## 2022-12-26 IMAGING — MG MM DIGITAL SCREENING BILAT W/ TOMO AND CAD
8 series · 8 of 24 positions shown · non-contrast
Comparison: Previous exam(s).

CLINICAL DATA: Screening.

EXAM:
DIGITAL SCREENING BILATERAL MAMMOGRAM WITH TOMOSYNTHESIS AND CAD
TECHNIQUE: Bilateral screening digital craniocaudal and mediolateral oblique
mammograms were obtained. Bilateral screening digital breast
tomosynthesis was performed. The images were evaluated with
computer-aided detection.

[R CC synth-2D]
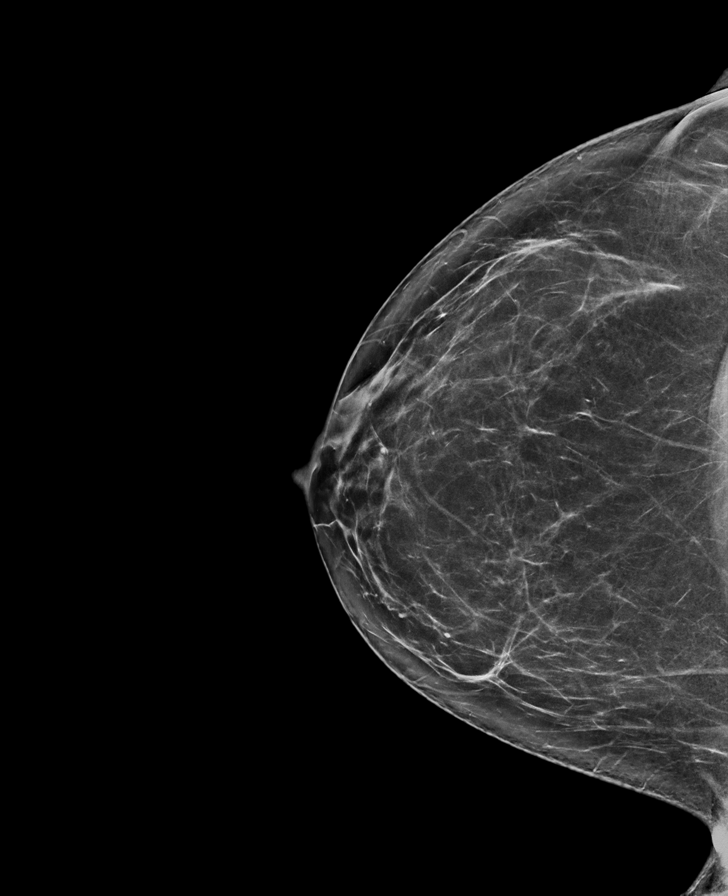

[L MLO synth-2D]
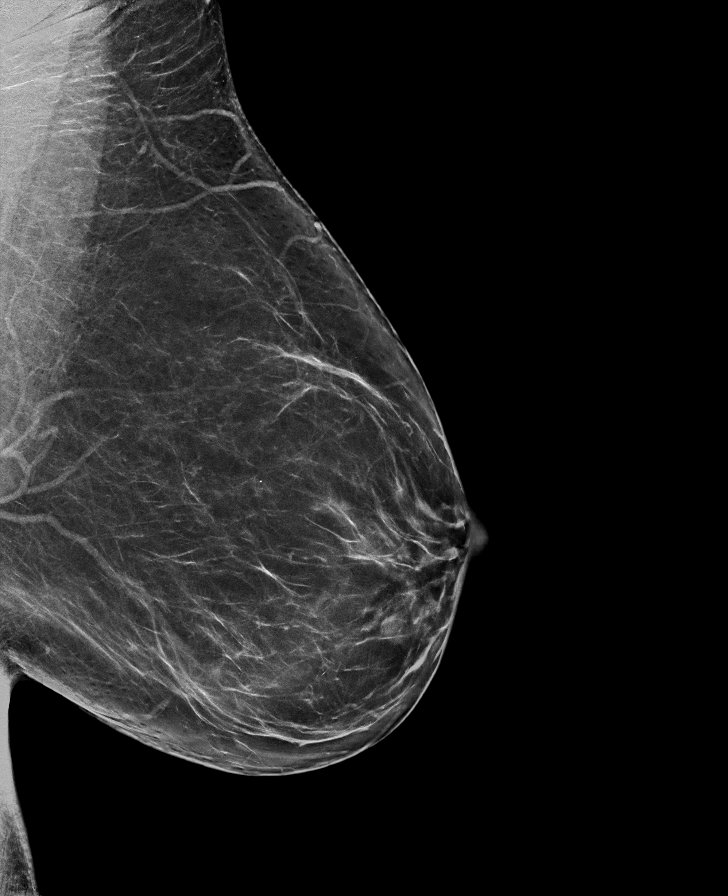

[R MLO synth-2D]
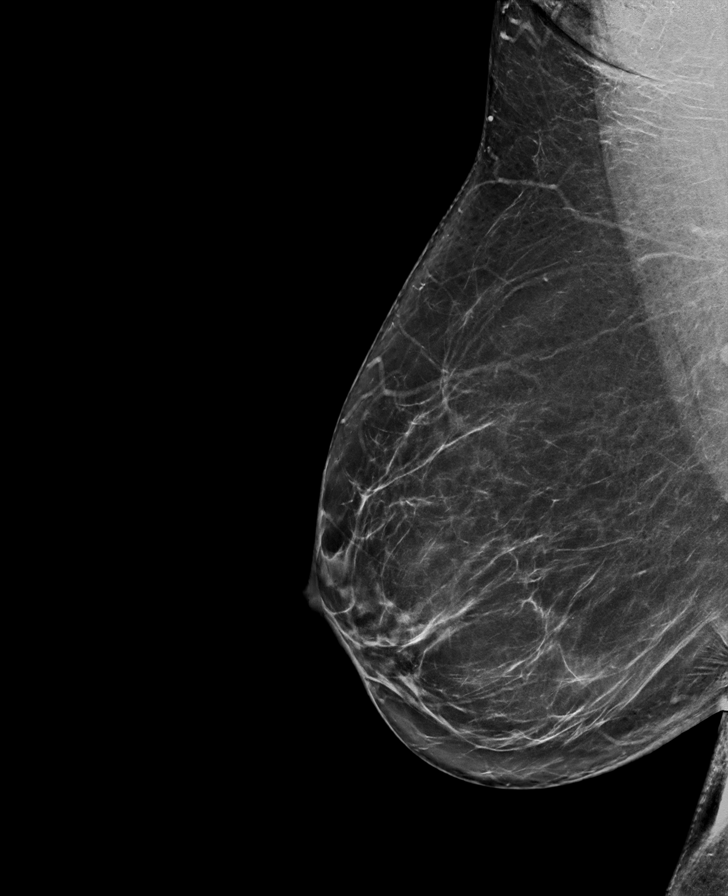

[L CC synth-2D]
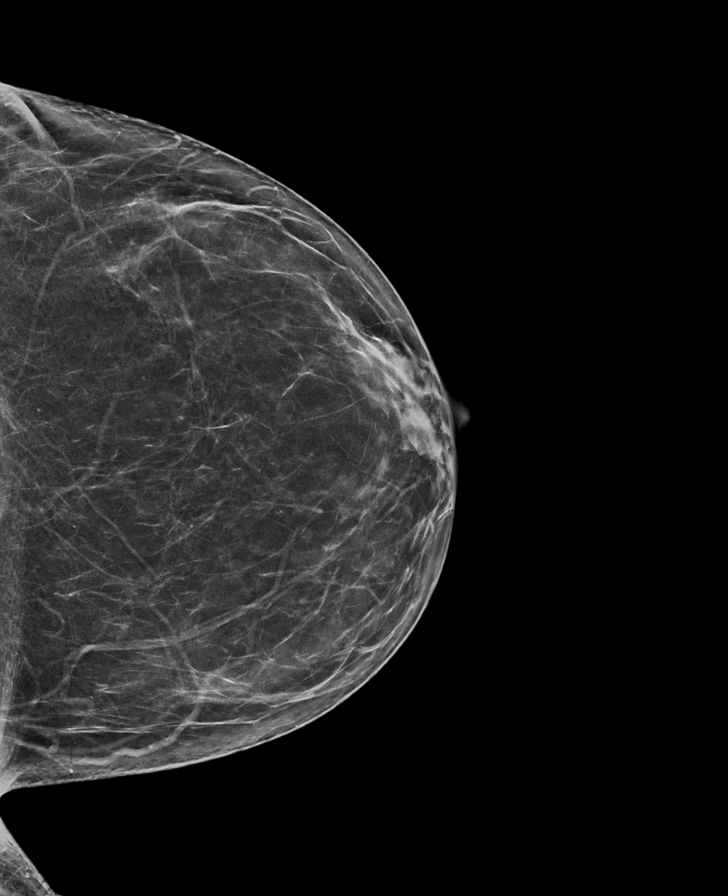

[L CC tomo · tomo slice 36/71.0]
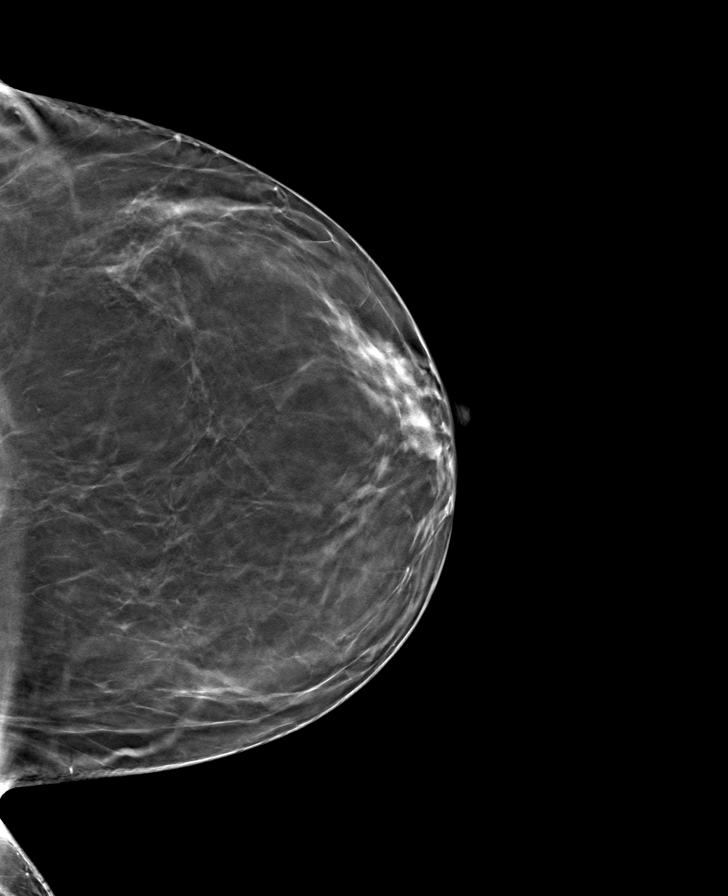

[L MLO tomo · tomo slice 41/81.0]
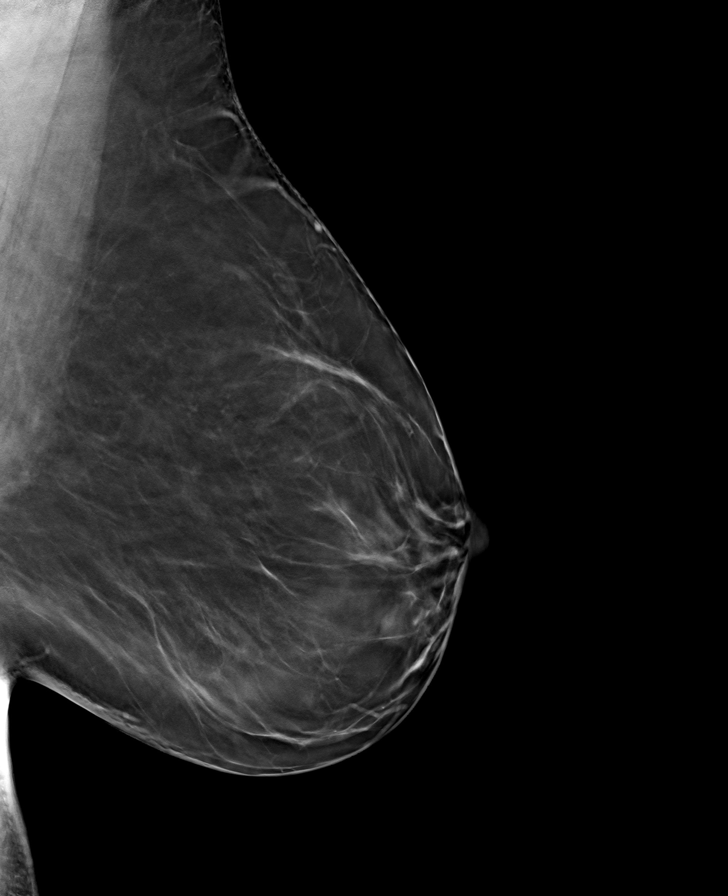

[R CC tomo · tomo slice 39/77.0]
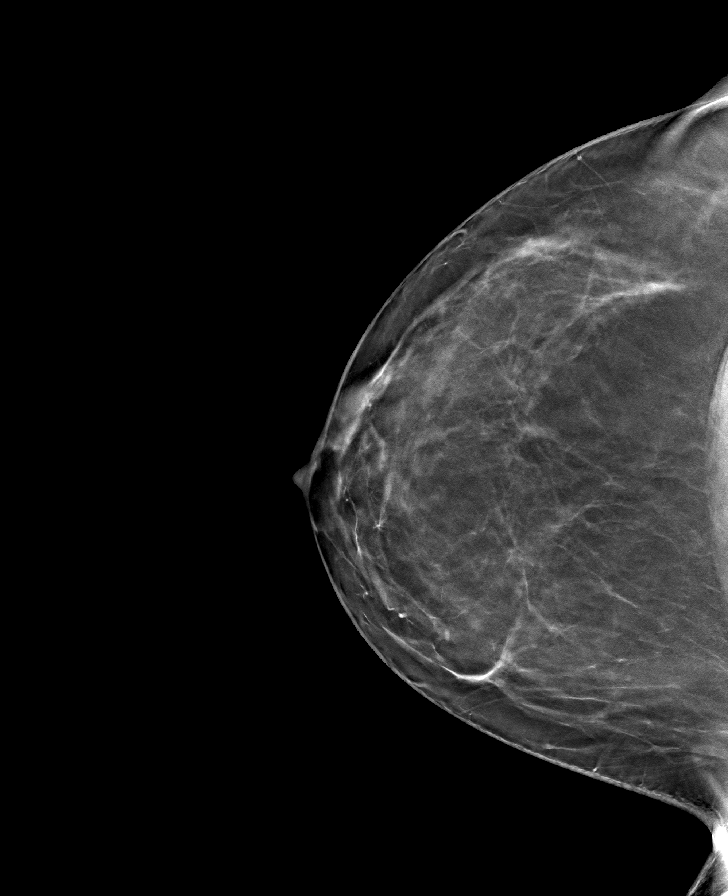

[R MLO tomo · tomo slice 42/83.0]
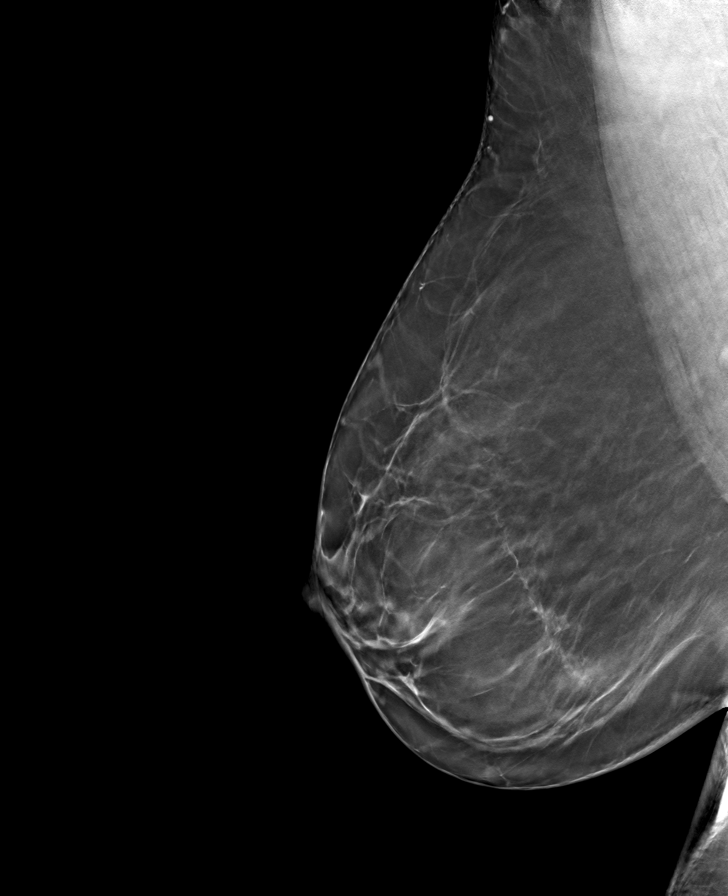

[8 of 24 positions shown; findings below may reference images not displayed]

ACR Breast Density Category b: There are scattered areas of
fibroglandular density.
FINDINGS: There are no findings suspicious for malignancy.
IMPRESSION: No mammographic evidence of malignancy. A result letter of this
screening mammogram will be mailed directly to the patient.

RECOMMENDATION:
Screening mammogram in one year. (Code:51-O-LD2)

BI-RADS CATEGORY  1: Negative.

## 2022-12-30 ENCOUNTER — Encounter: Payer: Self-pay | Admitting: Family Medicine

## 2022-12-30 ENCOUNTER — Other Ambulatory Visit: Payer: Self-pay

## 2022-12-30 MED ORDER — TEMAZEPAM 15 MG PO CAPS: ORAL_CAPSULE | ORAL | 0 refills | Status: DC

## 2022-12-30 NOTE — Telephone Encounter (Signed)
Sent to Dr Volanda Napoleon for approval

## 2023-01-20 NOTE — Progress Notes (Unsigned)
PCP:  Carollee Leitz, MD   No chief complaint on file.    HPI:      [name] is a 51 y.o. [phone] who LMP was No LMP recorded (lmp unknown). Patient has had a hysterectomy., presents today for her annual examination.  Her menses are absent due to TAH for leio/endometriosis. She does not have PMB. No vasomotor sx.  Sex activity: single partner, contraception - status post hysterectomy.  Last Pap:  01/16/21 and 01/15/20 Results were normal/ neg HPV DNA. 12/14/18  Results were ASCUS with pos HPV DNA; colpo with Dr. Glennon Mac 1/20 with neg bx; repeat pap Q3 yrs now Hx of STDs: HPV on pap  Treated for BV with metrogel 12/18/21 with sx relief. States pH is off when she checks it on "strips" but no vag sx. Hx of BV and yeast in past.   Last mammogram: 02/26/22 Results were: normal--routine follow-up in 12 months; has appt 3/24 through PCP There is a FH of breast cancer in her pat aunt. Pt is BRCA neg 2014, IBIS=16.8%. Pt has declined update testing in past. There is no FH of ovarian cancer. The patient does do self-breast exams.  Tobacco use: The patient denies current or previous tobacco use. Alcohol use: none No drug use.  Exercise: moderately active  She does get adequate calcium and Vitamin D in her diet.  Colonoscopy:  3/22 at [facility], repeat after 10 yrs per pt; FH in MGM about age 2  Labs with PCP.    Past Medical History:  Diagnosis Date   Abnormal thyroid function test 06/13/2018   Allergic rhinitis 07/10/2016   Allergy    Anxiety    Anxiety    Anxiety and depression 02/07/2018   ASCUS with positive high risk HPV cervical 12/2018   Asymptomatic microscopic hematuria 12/18/2021   Blood in stool    BMI 38.0-38.9,adult 06/24/2022   BV (bacterial vaginosis)    BV (bacterial vaginosis) 12/18/2021   Chicken pox    Contact dermatitis 08/03/2022   Depression, recurrent ([place]) 11/20/2020   Endometriosis    Family history of breast cancer 12/13/2017   Headache    Insomnia     Leg edema, left 02/07/2018   S/p fracture leg and ankle 10/2017    Leiomyoma of uterus    Menorrhagia    Seasonal allergies    Stress 07/16/2020   UTI (urinary tract infection)    Vitamin D deficiency     Past Surgical History:  Procedure Laterality Date   COLONOSCOPY     ~18 yrs ago    ENDOMETRIAL BIOPSY     ESOPHAGOGASTRODUODENOSCOPY     HYSTEROSCOPY  2004   LEG SURGERY  10/13/2017   s/p fall    ORIF ANKLE FRACTURE Left 10/13/2017   Procedure: OPEN REDUCTION INTERNAL FIXATION (ORIF) ANKLE FRACTURE;  Surgeon: Lovell Sheehan, MD;  Location: [facility] ORS;  Service: Orthopedics;  Laterality: Left;   TOTAL ABDOMINAL HYSTERECTOMY  01/2005   leio, endometriosis; Dr. Ammie Dalton; has ovaries and she thinks cervix   UPPER GASTROINTESTINAL ENDOSCOPY      Family History  Problem Relation Age of Onset   Breast cancer Mother 42   Hypertension Mother    Berenice Primas' disease Mother    Transient ischemic attack Mother    Aneurysm Mother        x3 brain   Aortic aneurysm Mother    Hypertension Brother    Colon cancer Maternal Grandmother 29   Lung cancer Maternal  Grandfather    Prostate cancer Maternal Grandfather    Diabetes Paternal Grandmother    Thyroid disease Maternal Aunt    Breast cancer Paternal Aunt 17   Diabetes Paternal Aunt    Colon polyps Neg Hx    Esophageal cancer Neg Hx    Rectal cancer Neg Hx    Stomach cancer Neg Hx     Social History   Socioeconomic History   Marital status: Divorced    Spouse name: Not on file   Number of children: Not on file   Years of education: Not on file   Highest education level: Not on file  Occupational History   Not on file  Tobacco Use   Smoking status: Never   Smokeless tobacco: Never  Vaping Use   Vaping Use: Never used  Substance and Sexual Activity   Alcohol use: Yes    Comment: occasionally   Drug use: No   Sexual activity: Not Currently    Birth control/protection: Surgical    Comment: Hysterectomy  Other Topics  Concern   Not on file  Social History Narrative   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    Wears seat belts    No guns    1 son in [place] lives in [place], 1 granddaughter age 61 y.o as of 06/2018 lives in [place] with her mother      Social Determinants of Health   Financial Resource Strain: Not on file  Food Insecurity: Not on file  Transportation Needs: Not on file  Physical Activity: Not on file  Stress: Not on file  Social Connections: Not on file  Intimate Partner Violence: Not on file    No outpatient medications have been marked as taking for the 01/21/23 encounter (Appointment) with Karen Kinnard, Deirdre Evener, PA-C.     ROS:  Review of Systems  Constitutional:  Negative for fatigue, fever and unexpected weight change.  Respiratory:  Negative for cough, shortness of breath and wheezing.   Cardiovascular:  Negative for chest pain, palpitations and leg swelling.  Gastrointestinal:  Negative for blood in stool, constipation, diarrhea, nausea and vomiting.  Endocrine: Negative for cold intolerance, heat intolerance and polyuria.  Genitourinary:  Negative for dyspareunia, dysuria, flank pain, frequency, genital sores, hematuria, menstrual problem, pelvic pain, urgency, vaginal bleeding, vaginal discharge and vaginal pain.  Musculoskeletal:  Negative for back pain, joint swelling and myalgias.  Skin:  Negative for rash.  Neurological:  Negative for dizziness, syncope, light-headedness, numbness and headaches.  Hematological:  Negative for adenopathy.  Psychiatric/Behavioral:  Negative for agitation, confusion, sleep disturbance and suicidal ideas. The patient is not nervous/anxious.      Objective: LMP  (LMP Unknown)    Physical Exam Constitutional:      Appearance: She is well-developed.  Genitourinary:     Vulva normal.     Genitourinary Comments: UTERUS/CX SURG REM     Right Labia: No rash, tenderness or lesions.    Left Labia: No tenderness, lesions or rash.     Vaginal cuff intact.    No vaginal discharge, erythema, tenderness or bleeding.      Right Adnexa: not tender and no mass present.    Left Adnexa: not tender and no mass present.    Cervix is absent.     No cervical friability or polyp.     Uterus is not enlarged or tender.     Uterus is absent.  Breasts:    Right: No mass,  nipple discharge, skin change or tenderness.     Left: No mass, nipple discharge, skin change or tenderness.  Neck:     Thyroid: No thyromegaly.  Cardiovascular:     Rate and Rhythm: Normal rate and regular rhythm.     Heart sounds: Normal heart sounds. No murmur heard. Pulmonary:     Effort: Pulmonary effort is normal.     Breath sounds: Normal breath sounds.  Abdominal:     Palpations: Abdomen is soft.     Tenderness: There is no abdominal tenderness. There is no guarding or rebound.  Musculoskeletal:        General: Normal range of motion.     Cervical back: Normal range of motion.  Lymphadenopathy:     Cervical: No cervical adenopathy.  Neurological:     General: No focal deficit present.     Mental Status: She is alert and oriented to person, place, and time.     Cranial Nerves: No cranial nerve deficit.  Skin:    General: Skin is warm and dry.  Psychiatric:        Mood and Affect: Mood normal.        Behavior: Behavior normal.        Thought Content: Thought content normal.        Judgment: Judgment normal.  Vitals and nursing note reviewed.     Assessment/Plan: Encounter for annual routine gynecological examination  Encounter for screening mammogram for malignant neoplasm of breast; pt has mammo order through PCP  Family history of breast cancer--pt is BRCA neg; has declined update testing. F/u prn.    GYN counsel breast self exam, mammography screening, adequate intake of calcium and vitamin D, diet and exercise     F/U  No follow-ups on file.  Kennley Schwandt B. Anis Cinelli, PA-C 01/20/2023 1:49 PM

## 2023-01-21 ENCOUNTER — Encounter: Payer: Self-pay | Admitting: Obstetrics and Gynecology

## 2023-01-21 ENCOUNTER — Ambulatory Visit (INDEPENDENT_AMBULATORY_CARE_PROVIDER_SITE_OTHER): Payer: BC Managed Care – PPO | Admitting: Obstetrics and Gynecology

## 2023-01-21 VITALS — BP 120/81 | HR 66 | Ht 65.0 in | Wt 217.0 lb

## 2023-01-21 DIAGNOSIS — Z803 Family history of malignant neoplasm of breast: Secondary | ICD-10-CM

## 2023-01-21 DIAGNOSIS — Z01419 Encounter for gynecological examination (general) (routine) without abnormal findings: Secondary | ICD-10-CM

## 2023-01-21 DIAGNOSIS — Z1231 Encounter for screening mammogram for malignant neoplasm of breast: Secondary | ICD-10-CM

## 2023-01-21 NOTE — Patient Instructions (Signed)
I value your feedback and you entrusting us with your care. If you get a [facility] patient survey, I would appreciate you taking the time to let us know about your experience today. Thank you! ? ? ?

## 2023-01-26 ENCOUNTER — Other Ambulatory Visit: Payer: Self-pay | Admitting: Family Medicine

## 2023-02-25 ENCOUNTER — Telehealth: Payer: Self-pay

## 2023-02-25 NOTE — Telephone Encounter (Signed)
Pt calling; has an irritation; can she see ABC next Tues?  [phone]

## 2023-02-26 ENCOUNTER — Other Ambulatory Visit: Payer: Self-pay | Admitting: Family Medicine

## 2023-03-01 NOTE — Progress Notes (Unsigned)
Erica Leitz, MD   No chief complaint on file.   HPI:      Erica Mccall is a 52 y.o. [id number] whose LMP was No LMP recorded (lmp unknown). Patient has had a hysterectomy., presents today for ***    Patient Active Problem List   Diagnosis Date Noted   Mood disorder ([place]) 11/24/2022   Abnormal glucose 11/24/2022   Hyperlipidemia 06/24/2022   PTSD (post-traumatic stress disorder) 07/16/2020   Annual physical exam 01/16/2020   Obesity (BMI 35.0-39.9 without comorbidity) 05/10/2018   Insomnia 02/07/2018    Past Surgical History:  Procedure Laterality Date   COLONOSCOPY     ~18 yrs ago    ENDOMETRIAL BIOPSY     ESOPHAGOGASTRODUODENOSCOPY     HYSTEROSCOPY  2004   LEG SURGERY  10/13/2017   s/p fall    ORIF ANKLE FRACTURE Left 10/13/2017   Procedure: OPEN REDUCTION INTERNAL FIXATION (ORIF) ANKLE FRACTURE;  Surgeon: Erica Sheehan, MD;  Location: [facility] ORS;  Service: Orthopedics;  Laterality: Left;   TOTAL ABDOMINAL HYSTERECTOMY  01/2005   leio, endometriosis; Dr. Ammie Mccall; has ovaries and she thinks cervix   UPPER GASTROINTESTINAL ENDOSCOPY      Family History  Problem Relation Age of Onset   Breast cancer Mother 80   Hypertension Mother    Erica Mccall' disease Mother    Transient ischemic attack Mother    Aneurysm Mother        x3 brain   Aortic aneurysm Mother    Hypertension Brother    Colon cancer Maternal Grandmother 61   Lung cancer Maternal Grandfather    Prostate cancer Maternal Grandfather    Diabetes Paternal Grandmother    Thyroid disease Maternal Aunt    Breast cancer Paternal Aunt 8   Diabetes Paternal Aunt    Colon polyps Neg Hx    Esophageal cancer Neg Hx    Rectal cancer Neg Hx    Stomach cancer Neg Hx     Social History   Socioeconomic History   Marital status: Divorced    Spouse name: Not on file   Number of children: Not on file   Years of education: Not on file   Highest education level: Not on file  Occupational History   Not on  file  Tobacco Use   Smoking status: Never   Smokeless tobacco: Never  Vaping Use   Vaping Use: Never used  Substance and Sexual Activity   Alcohol use: Yes    Comment: occasionally   Drug use: No   Sexual activity: Not Currently    Birth control/protection: Surgical    Comment: Hysterectomy  Other Topics Concern   Not on file  Social History Narrative   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  2   FLUoxetine (PROZAC) 20 MG tablet Take 1 tablet (20 mg total) by mouth daily. 90 tablet 3   fluticasone (FLONASE) 50 MCG/ACT nasal spray Place 1-2 sprays into both nostrils daily. 16 mL 12   ibuprofen (ADVIL,MOTRIN) 200 MG tablet Take 400 mg every 6 (six) hours as needed by mouth for headache.     montelukast (SINGULAIR) 10 MG tablet Take 1 tablet (10 mg total) by mouth at bedtime. 90 tablet 3   Probiotic CAPS Take 1 capsule daily by mouth.     temazepam (RESTORIL) 15 MG capsule TAKE 1 CAPSULE BY MOUTH EVERY NIGHT AT BEDTIME AS NEEDED 30 capsule 0   No facility-administered medications prior to visit.      ROS:  Review of Systems BREAST: No symptoms   OBJECTIVE:   Vitals:  LMP  (LMP Unknown)   Physical Exam  Results: No results found for this or any previous visit (from the past 24 hour(s)).   Assessment/Plan: No diagnosis found.    No orders of the defined types were placed in this  encounter.     No follow-ups on file.  Erica Eddington B. Chole Driver, PA-C 03/01/2023 7:32 PM

## 2023-03-01 NOTE — Telephone Encounter (Signed)
The patient is scheduled for 3/26 with ABC

## 2023-03-02 ENCOUNTER — Encounter: Payer: Self-pay | Admitting: Obstetrics and Gynecology

## 2023-03-02 ENCOUNTER — Ambulatory Visit (INDEPENDENT_AMBULATORY_CARE_PROVIDER_SITE_OTHER): Payer: BC Managed Care – PPO | Admitting: Obstetrics and Gynecology

## 2023-03-02 ENCOUNTER — Other Ambulatory Visit (HOSPITAL_COMMUNITY)
Admission: RE | Admit: 2023-03-02 | Discharge: 2023-03-02 | Disposition: A | Discharge status: Home / Self Care | Payer: BC Managed Care – PPO | Source: Ambulatory Visit | Attending: Obstetrics and Gynecology | Admitting: Obstetrics and Gynecology

## 2023-03-02 ENCOUNTER — Ambulatory Visit
Admission: RE | Admit: 2023-03-02 | Discharge: 2023-03-02 | Disposition: A | Discharge status: Home / Self Care | Payer: BC Managed Care – PPO | Source: Ambulatory Visit | Attending: Family Medicine | Admitting: Family Medicine

## 2023-03-02 VITALS — BP 138/70 | Ht 65.0 in | Wt 222.0 lb

## 2023-03-02 DIAGNOSIS — N760 Acute vaginitis: Secondary | ICD-10-CM | POA: Diagnosis not present

## 2023-03-02 DIAGNOSIS — Z1231 Encounter for screening mammogram for malignant neoplasm of breast: Secondary | ICD-10-CM | POA: Insufficient documentation

## 2023-03-02 DIAGNOSIS — B9689 Other specified bacterial agents as the cause of diseases classified elsewhere: Secondary | ICD-10-CM | POA: Insufficient documentation

## 2023-03-02 DIAGNOSIS — Z113 Encounter for screening for infections with a predominantly sexual mode of transmission: Secondary | ICD-10-CM | POA: Insufficient documentation

## 2023-03-02 DIAGNOSIS — N898 Other specified noninflammatory disorders of vagina: Secondary | ICD-10-CM

## 2023-03-02 LAB — POCT WET PREP WITH KOH
Clue Cells Wet Prep HPF POC: POSITIVE
KOH Prep POC: POSITIVE — AB
Trichomonas, UA: NEGATIVE
Yeast Wet Prep HPF POC: NEGATIVE

## 2023-03-02 MED ORDER — XACIATO 2 % VA GEL: VAGINAL | 0 refills | Status: DC

## 2023-03-02 NOTE — Patient Instructions (Signed)
I value your feedback and you entrusting us with your care. If you get a [facility] patient survey, I would appreciate you taking the time to let us know about your experience today. Thank you! ? ? ?

## 2023-03-04 LAB — CERVICOVAGINAL ANCILLARY ONLY
Bacterial Vaginitis (gardnerella): POSITIVE — AB
Candida Glabrata: NEGATIVE
Candida Vaginitis: NEGATIVE
Chlamydia: NEGATIVE
Comment: NEGATIVE
Comment: NEGATIVE
Comment: NEGATIVE
Comment: NEGATIVE
Comment: NORMAL
Neisseria Gonorrhea: NEGATIVE

## 2023-03-05 ENCOUNTER — Encounter: Payer: Self-pay | Admitting: Family Medicine

## 2023-03-08 ENCOUNTER — Other Ambulatory Visit: Payer: Self-pay | Admitting: Family Medicine

## 2023-03-09 NOTE — Telephone Encounter (Signed)
I called the patient and she is scheduled to see the provider to discuss. Erica Mccall,cma

## 2023-03-10 ENCOUNTER — Ambulatory Visit: Payer: BC Managed Care – PPO | Admitting: Family Medicine

## 2023-03-16 ENCOUNTER — Ambulatory Visit: Payer: BC Managed Care – PPO | Admitting: Family Medicine

## 2023-03-16 ENCOUNTER — Encounter: Payer: Self-pay | Admitting: Family Medicine

## 2023-03-16 VITALS — BP 128/78 | HR 68 | Temp 97.9°F | Ht 65.0 in | Wt 220.8 lb

## 2023-03-16 DIAGNOSIS — H6692 Otitis media, unspecified, left ear: Secondary | ICD-10-CM

## 2023-03-16 DIAGNOSIS — F39 Unspecified mood [affective] disorder: Secondary | ICD-10-CM

## 2023-03-16 DIAGNOSIS — G4700 Insomnia, unspecified: Secondary | ICD-10-CM

## 2023-03-16 MED ORDER — AMOXICILLIN-POT CLAVULANATE 875-125 MG PO TABS: 1.0000 | ORAL_TABLET | Freq: Two times a day (BID) | ORAL | 0 refills | Status: AC

## 2023-03-16 MED ORDER — ZOLPIDEM TARTRATE 5 MG PO TABS: 5.0000 mg | ORAL_TABLET | Freq: Every evening | ORAL | 0 refills | Status: DC | PRN

## 2023-03-16 NOTE — Progress Notes (Signed)
SUBJECTIVE:   Chief Complaint  Patient presents with   Acute Visit    Sleep issues/ concerns about left ear.    HPI Patient presents to clinic to discuss sleep issues.  Was previously on Restoril 30 mg but had agreed to decrease to 15 mg to taper to off.  Had also started Atarax but reports stopped taking secondary to increased fatigue during the day.  She would like to go back on Restoril 30 mg.  Has not tried any other medications.  Takes medication goes to bed at 10 PM, watches TV and sometimes falls asleep about an hour later.  She endorses not being able to stay asleep throughout the night.  She reports she was started on Restoril at a time in her life for she was going through a lot of issues with her family and increased stress at that time.  PERTINENT PMH / PSH: Sleep concerns Disorder  OBJECTIVE:  BP 128/78   Pulse 68   Temp 97.9 F (36.6 C) (Oral)   Ht  (1.651 m)   Wt 220 lb 12.8 oz (100.2 kg)   LMP  (LMP Unknown)   SpO2 98%   BMI 36.74 kg/m    Physical Exam Vitals reviewed.  Constitutional:      General: She is not in acute distress.    Appearance: Normal appearance. She is obese. She is not ill-appearing, toxic-appearing or diaphoretic.  HENT:     Right Ear: Hearing, tympanic membrane, ear canal and external ear normal.     Left Ear: Hearing and external ear normal. A middle ear effusion is present. Tympanic membrane is erythematous and bulging.  Eyes:     General:        Right eye: No discharge.        Left eye: No discharge.     Conjunctiva/sclera: Conjunctivae normal.  Cardiovascular:     Rate and Rhythm: Normal rate and regular rhythm.     Heart sounds: Normal heart sounds.  Pulmonary:     Effort: Pulmonary effort is normal.     Breath sounds: Normal breath sounds.  Abdominal:     General: Bowel sounds are normal.  Musculoskeletal:        General: Normal range of motion.  Skin:    General: Skin is warm and dry.  Neurological:      General: No focal deficit present.     Mental Status: She is alert and oriented to person, place, and time. Mental status is at baseline.  Psychiatric:        Mood and Affect: Mood normal.        Behavior: Behavior normal.        Thought Content: Thought content normal.        Judgment: Judgment normal.   .phq  ASSESSMENT/PLAN:  Insomnia, unspecified type Assessment & Plan: Chronic.  Temazepam had been decreased from 30 mg to 15 mg given risk of association with cognitive decline long-term use.  Patient had been agreeable for decreasing dose.  Atarax has not helped.  Poor sleep hygiene techniques Recommend better sleep hygiene She is agreeable to continuing temazepam 15 mg at night Agreeable to trial of Ambien 5 mg nightly.  Aware not to take with temazepam to see if will help. Follow-up in 2 weeks or sooner if needed    Orders: -     Zolpidem Tartrate; Take 1 tablet (5 mg total) by mouth at bedtime as needed for sleep.  Dispense: 30 tablet;  Refill: 0  Mood disorder Assessment & Plan: Chronic.  Doing well on current medication.  Denies any SI/HI. Negative PHQ-9/GAD 7 screening Continue Prozac 20 mg daily Follow-up as needed.    Left otitis media, unspecified otitis media type Assessment & Plan: Has been ongoing since COVID infection around 12/23. Has had some decreased hearing left ear without tinnitus.  Nasal congestion Middle ear effusion Start antibiotics. Start probiotics daily and continue for 2 weeks after treatment If no improvement follow-up with PCP  Orders: -     Amoxicillin-Pot Clavulanate; Take 1 tablet by mouth 2 (two) times daily for 5 days.  Dispense: 10 tablet; Refill: 0   PDMP reviewed  Return in about 2 weeks (around 03/30/2023) for PCP.  Dana Allan, MD

## 2023-03-16 NOTE — Patient Instructions (Addendum)
It was a pleasure meeting you today. Thank you for allowing me to take part in your health care.  Our goals for today as we discussed include:  Start Ambien 5 mg at night.  Limit use of Temazepam   Start Augmentin 1 tablet 2 times a day for 5 days Continue Probiotics daily while on antibiotics  Follow up in 2 weeks  If you have any questions or concerns, please do not hesitate to call the office at [phone].  I look forward to our next visit and until then take care and stay safe.  Regards,   Dana Allan, MD   [facility]

## 2023-03-21 ENCOUNTER — Encounter: Payer: Self-pay | Admitting: Family Medicine

## 2023-03-21 DIAGNOSIS — H6690 Otitis media, unspecified, unspecified ear: Secondary | ICD-10-CM | POA: Insufficient documentation

## 2023-03-21 NOTE — Assessment & Plan Note (Signed)
Chronic.  Doing well on current medication.  Denies any SI/HI. Negative PHQ-9/GAD 7 screening Continue Prozac 20 mg daily Follow-up as needed.

## 2023-03-21 NOTE — Assessment & Plan Note (Addendum)
Has been ongoing since COVID infection around 12/23. Has had some decreased hearing left ear without tinnitus.  Nasal congestion Middle ear effusion Start antibiotics. Start probiotics daily and continue for 2 weeks after treatment If no improvement follow-up with PCP

## 2023-03-21 NOTE — Assessment & Plan Note (Signed)
Chronic.  Temazepam had been decreased from 30 mg to 15 mg given risk of association with cognitive decline long-term use.  Patient had been agreeable for decreasing dose.  Atarax has not helped.  Poor sleep hygiene techniques Recommend better sleep hygiene She is agreeable to continuing temazepam 15 mg at night Agreeable to trial of Ambien 5 mg nightly.  Aware not to take with temazepam to see if will help. Follow-up in 2 weeks or sooner if needed

## 2023-03-22 ENCOUNTER — Other Ambulatory Visit: Payer: Self-pay | Admitting: Family Medicine

## 2023-03-22 DIAGNOSIS — G4700 Insomnia, unspecified: Secondary | ICD-10-CM

## 2023-03-22 MED ORDER — TRAZODONE HCL 50 MG PO TABS: 25.0000 mg | ORAL_TABLET | Freq: Every evening | ORAL | 0 refills | Status: DC | PRN

## 2023-03-23 NOTE — Telephone Encounter (Signed)
My Chart message sent

## 2023-03-29 NOTE — Patient Instructions (Incomplete)
It was a pleasure meeting you today. Thank you for allowing me to take part in your health care.  Our goals for today as we discussed include:  Stop Trazodone Stop Ambien  Increase Temazepam to 30 mg at night Continue to practice sleep hygiene   Referral sent for sleep studies  If you have any questions or concerns, please do not hesitate to call the office at [phone].  I look forward to our next visit and until then take care and stay safe.  Regards,   Dana Allan, MD   [facility]

## 2023-03-29 NOTE — Progress Notes (Signed)
   SUBJECTIVE:   Chief Complaint  Patient presents with   Medical Management of Chronic Issues    2 wk f/u   HPI Patient presents to clinic for follow up sleep issues.  Tried Ambien and was not helpful in keeping asleep. Made her feel tired next day. Tried Trazadone 25 mg for few nights still not able to remain asleep.  Increased to 50 mg and not effective.  Would like to resume Restoril 30 mg.   No previous sleep studies.  Endorses snoring at night. Unknown if any apneic periods.   PERTINENT PMH / PSH: Sleep Disorder  OBJECTIVE:  BP 110/78   Pulse 67   Temp 97.8 F (36.6 C)   Ht 5\' 5"  (1.651 m)   Wt 224 lb 12.8 oz (102 kg)   LMP  (LMP Unknown)   SpO2 98%   BMI 37.41 kg/m    Physical Exam Vitals reviewed.  Constitutional:      General: She is not in acute distress.    Appearance: Normal appearance. She is obese. She is not ill-appearing, toxic-appearing or diaphoretic.  Eyes:     General:        Right eye: No discharge.        Left eye: No discharge.     Conjunctiva/sclera: Conjunctivae normal.  Cardiovascular:     Rate and Rhythm: Normal rate.  Pulmonary:     Effort: Pulmonary effort is normal.  Skin:    General: Skin is warm and dry.  Neurological:     Mental Status: She is alert and oriented to person, place, and time. Mental status is at baseline.  Psychiatric:        Mood and Affect: Mood normal.        Behavior: Behavior normal.        Thought Content: Thought content normal. Thought content does not include suicidal ideation. Thought content does not include suicidal plan.        Judgment: Judgment normal.     ASSESSMENT/PLAN:  Insomnia, unspecified type Assessment & Plan: Chronic.  Ambien and Trazadone ineffective.  Lower dose of Temazepam ineffective. Discontinue trazodone Discontinue Ambien Increase temazepam from 15 mg back to 30 mg nightly.  Previously discussed risks of long-term use of benzodiazepines with patient. None opioid schedule drug  contract completed Referral sent to pulmonology for sleep studies Epworth sleepiness score 7, STOP-BANG intermediate risk OSA Continue to work on sleep hygiene Follow-up every 3 monthly for evaluation of medications.     Orders: -     Temazepam; Take one tablet at night for sleep as needed  Dispense: 30 capsule; Refill: 0 -     Ambulatory referral to Pulmonology  Seasonal allergies Assessment & Plan: Chronic.  Continues to have some nasal and left ear congestion. Start Flonase 2 sprays daily Humidified air follow-up needed Follow-up as needed  Orders: -     Fluticasone Propionate; Place 2 sprays into both nostrils daily.  Dispense: 15.8 mL; Refill: 2  Mood disorder ([facility]) Assessment & Plan: Chronic.  Stable.  Doing well on current medication.  Denies any SI/HI Continue Prozac 20 mg daily Follow-up as needed.      PDMP reviewed  Return in about 3 months (around 06/29/2023), or if symptoms worsen or fail to improve.  Dana Allan, MD

## 2023-03-30 ENCOUNTER — Encounter: Payer: Self-pay | Admitting: Family Medicine

## 2023-03-30 ENCOUNTER — Ambulatory Visit: Payer: BC Managed Care – PPO | Admitting: Family Medicine

## 2023-03-30 VITALS — BP 110/78 | HR 67 | Temp 97.8°F | Ht 65.0 in | Wt 224.8 lb

## 2023-03-30 DIAGNOSIS — F39 Unspecified mood [affective] disorder: Secondary | ICD-10-CM | POA: Diagnosis not present

## 2023-03-30 DIAGNOSIS — G4700 Insomnia, unspecified: Secondary | ICD-10-CM | POA: Diagnosis not present

## 2023-03-30 DIAGNOSIS — J302 Other seasonal allergic rhinitis: Secondary | ICD-10-CM

## 2023-03-30 MED ORDER — TEMAZEPAM 30 MG PO CAPS: ORAL_CAPSULE | ORAL | 0 refills | Status: DC

## 2023-03-30 MED ORDER — FLUTICASONE PROPIONATE 50 MCG/ACT NA SUSP: 2.0000 | Freq: Every day | NASAL | 2 refills | Status: AC

## 2023-04-09 ENCOUNTER — Encounter: Payer: Self-pay | Admitting: Family Medicine

## 2023-04-09 DIAGNOSIS — J302 Other seasonal allergic rhinitis: Secondary | ICD-10-CM | POA: Insufficient documentation

## 2023-04-09 NOTE — Assessment & Plan Note (Signed)
Chronic.  Ambien and Trazadone ineffective.  Lower dose of Temazepam ineffective. Discontinue trazodone Discontinue Ambien Increase temazepam from 15 mg back to 30 mg nightly.  Previously discussed risks of long-term use of benzodiazepines with patient. None opioid schedule drug contract completed Referral sent to pulmonology for sleep studies Epworth sleepiness score 7, STOP-BANG intermediate risk OSA Continue to work on sleep hygiene Follow-up every 3 monthly for evaluation of medications.

## 2023-04-09 NOTE — Assessment & Plan Note (Signed)
Chronic.  Continues to have some nasal and left ear congestion. Start Flonase 2 sprays daily Humidified air follow-up needed Follow-up as needed

## 2023-04-09 NOTE — Assessment & Plan Note (Signed)
Chronic.  Stable.  Doing well on current medication.  Denies any SI/HI Continue Prozac 20 mg daily Follow-up as needed.

## 2023-04-27 ENCOUNTER — Other Ambulatory Visit: Payer: Self-pay | Admitting: Family

## 2023-05-03 ENCOUNTER — Other Ambulatory Visit: Payer: Self-pay | Admitting: Family Medicine

## 2023-05-03 DIAGNOSIS — G4700 Insomnia, unspecified: Secondary | ICD-10-CM

## 2023-05-04 MED ORDER — TEMAZEPAM 30 MG PO CAPS: ORAL_CAPSULE | ORAL | 0 refills | Status: DC

## 2023-05-07 ENCOUNTER — Institutional Professional Consult (permissible substitution): Payer: BC Managed Care – PPO | Admitting: Primary Care

## 2023-05-14 ENCOUNTER — Telehealth: Payer: Self-pay

## 2023-05-14 NOTE — Telephone Encounter (Signed)
Spoke to patient and confirmed that she has not had previous sleep study.  Nothing further needed.  

## 2023-05-17 ENCOUNTER — Encounter: Payer: Self-pay | Admitting: Adult Health

## 2023-05-17 ENCOUNTER — Ambulatory Visit (INDEPENDENT_AMBULATORY_CARE_PROVIDER_SITE_OTHER): Payer: BC Managed Care – PPO | Admitting: Adult Health

## 2023-05-17 VITALS — BP 120/80 | HR 79 | Ht 65.0 in | Wt 225.2 lb

## 2023-05-17 DIAGNOSIS — G4700 Insomnia, unspecified: Secondary | ICD-10-CM

## 2023-05-17 DIAGNOSIS — R0683 Snoring: Secondary | ICD-10-CM | POA: Diagnosis not present

## 2023-05-17 NOTE — Patient Instructions (Signed)
Set up for home sleep study Healthy sleep regimen Healthy weight loss Do not drive if sleepy Follow-up in 6 weeks to discuss sleep study results and treatment plan

## 2023-05-17 NOTE — Assessment & Plan Note (Signed)
Snoring, restless sleep, chronic insomnia, daytime sleepiness suspicious for underlying sleep apnea.  Set patient up for home sleep study.  Patient education was given.  - discussed how weight can impact sleep and risk for sleep disordered breathing - discussed options to assist with weight loss: combination of diet modification, cardiovascular and strength training exercises   - had an extensive discussion regarding the adverse health consequences related to untreated sleep disordered breathing - specifically discussed the risks for hypertension, coronary artery disease, cardiac dysrhythmias, cerebrovascular disease, and diabetes - lifestyle modification discussed   - discussed how sleep disruption can increase risk of accidents, particularly when driving - safe driving practices were discussed   Plan Patient Instructions  Set up for home sleep study Healthy sleep regimen Healthy weight loss Do not drive if sleepy Follow-up in 6 weeks to discuss sleep study results and treatment plan

## 2023-05-17 NOTE — Progress Notes (Signed)
@Patient  ID: Erica Mccall, female    DOB: December 23, 1970, 52 y.o.   MRN: [id number]  Chief Complaint  Patient presents with   Consult    Referring provider: Dana Allan, MD  HPI: 52 year old female seen for sleep consult May 17, 2023 for snoring, restless sleep and daytime sleepiness  TEST/EVENTS :   05/17/2023 Sleep consult  Patient presents for sleep consult today.  Kindly referred by her primary care provider Dr. Clent Mccall.  Patient complains of loud snoring, restless sleep, daytime sleepiness.  Patient goes to sleep around 10:30 PM.  Takes up to 1 hour to go to sleep.  Is up several times throughout the night.  Gets up at 6 AM.  Has never had a sleep study before.  No history of congestive heart failure or stroke.  Does not nap.    No removable dental work.  No symptoms suspicious for cataplexy or sleep paralysis.  Epworth score is 5 out of 24.  Typically gets sleepy if she sits down to rest, watch TV and in the evening hours. No history of CHF or CVA. Takes Temazepam At bedtime  for insomnia, works very good for her. When she does not take she can't sleep well . Wakes up frequently. Has minimal caffeine intake. Tried ambien and trazodone in past but could not tolerate.    Social history patient is single.  Lives at home with her son.  Works as a Chief of Staff.  No smoking.  No alcohol or drug use.  Family history positive for allergies and cancer.  Allergies  Allergen Reactions   Iodine Hives   Latex Itching   Adipex-P [Phentermine]     jittery   Fish Allergy     Hives even to smell of fish   Pollen Extract    Valium [Diazepam]     Does not like way makes her feel     Immunization History  Administered Date(s) Administered   Influenza,inj,Quad PF,6+ Mos 09/02/2018, 09/22/2019, 11/06/2020, 11/20/2021, 09/28/2022   Influenza-Unspecified 09/12/2019   Moderna Sars-Covid-2 Vaccination 02/14/2020, 03/13/2020, 11/15/2020   Tdap 11/24/2022   Zoster Recombinat (Shingrix)  06/24/2022, 09/28/2022    Past Medical History:  Diagnosis Date   Abnormal thyroid function test 06/13/2018   Allergic rhinitis 07/10/2016   Allergy    Anxiety    Anxiety    Anxiety and depression 02/07/2018   ASCUS with positive high risk HPV cervical 12/2018   Asymptomatic microscopic hematuria 12/18/2021   Blood in stool    BMI 38.0-38.9,adult 06/24/2022   BV (bacterial vaginosis)    BV (bacterial vaginosis) 12/18/2021   Chicken pox    Contact dermatitis 08/03/2022   Depression, recurrent (HCC) 11/20/2020   Endometriosis    Family history of breast cancer 12/13/2017   Headache    Insomnia    Leg edema, left 02/07/2018   S/p fracture leg and ankle 10/2017    Leiomyoma of uterus    Menorrhagia    Seasonal allergies    Stress 07/16/2020   UTI (urinary tract infection)    Vitamin D deficiency     Tobacco History: Social History   Tobacco Use  Smoking Status Never  Smokeless Tobacco Never   Counseling given: Not Answered   Outpatient Medications Prior to Visit  Medication Sig Dispense Refill   Cholecalciferol (VITAMIN D3) 50000 units CAPS Take 50,000 Units every Thursday by mouth.   2   FLUoxetine (PROZAC) 20 MG tablet Take 1 tablet (20 mg total) by mouth daily. 90 tablet  3   fluticasone (FLONASE) 50 MCG/ACT nasal spray Place 2 sprays into both nostrils daily. 15.8 mL 2   ibuprofen (ADVIL,MOTRIN) 200 MG tablet Take 400 mg every 6 (six) hours as needed by mouth for headache.     Probiotic CAPS Take 1 capsule daily by mouth.     temazepam (RESTORIL) 30 MG capsule Take one tablet at night for sleep as needed 30 capsule 0   No facility-administered medications prior to visit.     Review of Systems:   Constitutional:   No  weight loss, night sweats,  Fevers, chills, fatigue, or  lassitude.  HEENT:   No headaches,  Difficulty swallowing,  Tooth/dental problems, or  Sore throat,                No sneezing, itching, ear ache, nasal congestion, post nasal drip,    CV:  No chest pain,  Orthopnea, PND, swelling in lower extremities, anasarca, dizziness, palpitations, syncope.   GI  No heartburn, indigestion, abdominal pain, nausea, vomiting, diarrhea, change in bowel habits, loss of appetite, bloody stools.   Resp: No shortness of breath with exertion or at rest.  No excess mucus, no productive cough,  No non-productive cough,  No coughing up of blood.  No change in color of mucus.  No wheezing.  No chest wall deformity  Skin: no rash or lesions.  GU: no dysuria, change in color of urine, no urgency or frequency.  No flank pain, no hematuria   MS:  No joint pain or swelling.  No decreased range of motion.  No back pain.    Physical Exam  BP 120/80 (BP Location: Left Arm, Patient Position: Sitting, Cuff Size: Large)   Pulse 79   Ht 5\' 5"  (1.651 m)   Wt 225 lb 3.2 oz (102.2 kg)   LMP  (LMP Unknown)   SpO2 100%   BMI 37.48 kg/m   GEN: A/Ox3; pleasant , NAD, well nourished    HEENT:  [place]/AT,    NOSE-clear, THROAT-clear, no lesions, no postnasal drip or exudate noted. Class 2-3 MP airway   NECK:  Supple w/ fair ROM; no JVD; normal carotid impulses w/o bruits; no thyromegaly or nodules palpated; no lymphadenopathy.    RESP  Clear  P & A; w/o, wheezes/ rales/ or rhonchi. no accessory muscle use, no dullness to percussion  CARD:  RRR, no m/r/g, no peripheral edema, pulses intact, no cyanosis or clubbing.  GI:   Soft & nt; nml bowel sounds; no organomegaly or masses detected.   Musco: Warm bil, no deformities or joint swelling noted.   Neuro: alert, no focal deficits noted.    Skin: Warm, no lesions or rashes    Lab Results:     BNP No results found for: "BNP"  ProBNP No results found for: "PROBNP"  Imaging: No results found.        No data to display          No results found for: "NITRICOXIDE"      Assessment & Plan:   Snoring Snoring, restless sleep, chronic insomnia, daytime sleepiness suspicious for  underlying sleep apnea.  Set patient up for home sleep study.  Patient education was given.  - discussed how weight can impact sleep and risk for sleep disordered breathing - discussed options to assist with weight loss: combination of diet modification, cardiovascular and strength training exercises   - had an extensive discussion regarding the adverse health consequences related to untreated sleep disordered breathing -  specifically discussed the risks for hypertension, coronary artery disease, cardiac dysrhythmias, cerebrovascular disease, and diabetes - lifestyle modification discussed   - discussed how sleep disruption can increase risk of accidents, particularly when driving - safe driving practices were discussed   Plan Patient Instructions  Set up for home sleep study Healthy sleep regimen Healthy weight loss Do not drive if sleepy Follow-up in 6 weeks to discuss sleep study results and treatment plan    Insomnia Chronic insomnia.  Previously tried Ambien and trazodone but was intolerable.  Says that temazepam is working well for her.  She is able to go to sleep and stay asleep more efficiently.  Healthy sleep regimen was discussed with patient.  Will set patient up for home sleep study to rule out underlying sleep apnea.  Plan  Patient Instructions  Set up for home sleep study Healthy sleep regimen Healthy weight loss Do not drive if sleepy Follow-up in 6 weeks to discuss sleep study results and treatment plan      Rubye Oaks, NP 05/17/2023

## 2023-05-17 NOTE — Assessment & Plan Note (Signed)
Chronic insomnia.  Previously tried Ambien and trazodone but was intolerable.  Says that temazepam is working well for her.  She is able to go to sleep and stay asleep more efficiently.  Healthy sleep regimen was discussed with patient.  Will set patient up for home sleep study to rule out underlying sleep apnea.  Plan  Patient Instructions  Set up for home sleep study Healthy sleep regimen Healthy weight loss Do not drive if sleepy Follow-up in 6 weeks to discuss sleep study results and treatment plan

## 2023-05-17 NOTE — Progress Notes (Signed)
Reviewed and agree with assessment/plan.   Coralyn Helling, MD [facility] Pulmonary/Critical Care 05/17/2023, 4:31 PM Pager:  [phone]

## 2023-06-04 ENCOUNTER — Other Ambulatory Visit: Payer: Self-pay | Admitting: Family Medicine

## 2023-06-04 DIAGNOSIS — G4700 Insomnia, unspecified: Secondary | ICD-10-CM

## 2023-06-07 ENCOUNTER — Encounter: Payer: Self-pay | Admitting: Family Medicine

## 2023-06-08 ENCOUNTER — Other Ambulatory Visit: Payer: Self-pay

## 2023-06-08 DIAGNOSIS — G4700 Insomnia, unspecified: Secondary | ICD-10-CM

## 2023-06-08 MED ORDER — TEMAZEPAM 30 MG PO CAPS: ORAL_CAPSULE | ORAL | 0 refills | Status: DC

## 2023-07-02 ENCOUNTER — Ambulatory Visit: Payer: BC Managed Care – PPO | Admitting: Adult Health

## 2023-07-08 ENCOUNTER — Other Ambulatory Visit: Payer: Self-pay | Admitting: Family Medicine

## 2023-07-08 DIAGNOSIS — G4700 Insomnia, unspecified: Secondary | ICD-10-CM

## 2023-07-19 ENCOUNTER — Other Ambulatory Visit: Payer: Self-pay | Admitting: Family Medicine

## 2023-08-11 ENCOUNTER — Other Ambulatory Visit: Payer: Self-pay | Admitting: Family Medicine

## 2023-08-11 DIAGNOSIS — G4700 Insomnia, unspecified: Secondary | ICD-10-CM

## 2023-09-07 ENCOUNTER — Other Ambulatory Visit: Payer: Self-pay | Admitting: Family Medicine

## 2023-09-07 DIAGNOSIS — G4700 Insomnia, unspecified: Secondary | ICD-10-CM

## 2023-10-05 ENCOUNTER — Other Ambulatory Visit: Payer: Self-pay | Admitting: Obstetrics and Gynecology

## 2023-10-05 ENCOUNTER — Other Ambulatory Visit: Payer: Self-pay | Admitting: Family Medicine

## 2023-10-05 DIAGNOSIS — B9689 Other specified bacterial agents as the cause of diseases classified elsewhere: Secondary | ICD-10-CM

## 2023-10-05 DIAGNOSIS — G4700 Insomnia, unspecified: Secondary | ICD-10-CM

## 2023-10-11 ENCOUNTER — Telehealth: Payer: PRIVATE HEALTH INSURANCE | Admitting: Family Medicine

## 2023-10-11 ENCOUNTER — Encounter: Payer: Self-pay | Admitting: Family Medicine

## 2023-10-11 VITALS — Ht 65.0 in | Wt 225.0 lb

## 2023-10-11 DIAGNOSIS — R232 Flushing: Secondary | ICD-10-CM | POA: Diagnosis not present

## 2023-10-11 NOTE — Progress Notes (Signed)
Virtual Visit via Video note  I connected with Trysten R Duve on 10/16/23 at 1642 by video and verified that I am speaking with the correct person using two identifiers. Fennie Panetta Jellison is currently located at work and  is currently alone during visit. The provider, Dana Allan, MD is located in their office at time of visit.  I discussed the limitations, risks, security and privacy concerns of performing an evaluation and management service by video and the availability of in person appointments. I also discussed with the patient that there may be a patient responsible charge related to this service. The patient expressed understanding and agreed to proceed.  Subjective: PCP: Dana Allan, MD  Chief Complaint  Patient presents with   Hot Flashes    HPI Discussed the use of AI scribe software for clinical note transcription with the patient, who gave verbal consent to proceed.  History of Present Illness The patient, born in [birth year], presents with a chief complaint of hot flashes that have been occurring for the past six months and have worsened over time. The hot flashes occur at any time of the day and are described as irritating but not severe enough to necessitate a change of clothes. The patient denies any associated sweating.  The patient has a history of a partial hysterectomy at the age of 71 and is currently on fluoxetine, which has not been effective in managing the hot flashes. The patient has not discussed these symptoms with their OBGYN yet. They express a preference for non-hormonal treatment options and are interested in exploring over-the-counter remedies.  The patient denies any history of high blood pressure, blood clots, migraines, or liver disease. They are scheduled for routine blood work next month. The patient is currently employed and was at work during the consultation.   ROS: Per HPI  Current Outpatient Medications:    Cholecalciferol (VITAMIN D3) 50000 units CAPS,  Take 50,000 Units every Thursday by mouth. , Disp: , Rfl: 2   FLUoxetine (PROZAC) 20 MG capsule, TAKE 1 CAPSULE(20 MG) BY MOUTH DAILY, Disp: 90 capsule, Rfl: 3   fluticasone (FLONASE) 50 MCG/ACT nasal spray, Place 2 sprays into both nostrils daily., Disp: 15.8 mL, Rfl: 2   ibuprofen (ADVIL,MOTRIN) 200 MG tablet, Take 400 mg every 6 (six) hours as needed by mouth for headache., Disp: , Rfl:    Probiotic CAPS, Take 1 capsule daily by mouth., Disp: , Rfl:    temazepam (RESTORIL) 30 MG capsule, TAKE 1 CAPSULE BY MOUTH AT NIGHT AS NEEDED FOR SLEEP, Disp: 30 capsule, Rfl: 0  Observations/Objective: Physical Exam Pulmonary:     Effort: Pulmonary effort is normal.  Neurological:     Mental Status: She is alert and oriented to person, place, and time. Mental status is at baseline.  Psychiatric:        Mood and Affect: Mood normal.        Behavior: Behavior normal.        Thought Content: Thought content normal.        Judgment: Judgment normal.    Assessment and Plan: Hot flashes Assessment & Plan: Persistent hot flashes for the past six months, not associated with sweating or need to change clothes. Currently on Fluoxetine, which is not providing relief. Patient has a history of partial hysterectomy. No history of high blood pressure, blood clots, or migraines. -Consider non-hormonal treatment options such as Veozah, pending insurance approval and liver function tests. -Consider Gabapentin as an off-label treatment option. -Consider switching  from Fluoxetine to Paxil, although patient is currently doing well on Fluoxetine. -Consider referral to [facility] MD for specialized menopausal symptom management. -Send patient information on various treatment options for review and shared decision-making. -Schedule blood work next month to check TSH and A1C levels, as these were not done previously. -Follow-up appointment to be scheduled at patient's discretion once she has reviewed treatment options.       Follow Up Instructions: Return if symptoms worsen or fail to improve, for PCP.   I discussed the assessment and treatment plan with the patient. The patient was provided an opportunity to ask questions and all were answered. The patient agreed with the plan and demonstrated an understanding of the instructions.   The patient was advised to call back or seek an in-person evaluation if the symptoms worsen or if the condition fails to improve as anticipated.  The above assessment and management plan was discussed with the patient. The patient verbalized understanding of and has agreed to the management plan. Patient is aware to call the clinic if symptoms persist or worsen. Patient is aware when to return to the clinic for a follow-up visit. Patient educated on when it is appropriate to go to the emergency department.     Dana Allan, MD

## 2023-10-11 NOTE — Patient Instructions (Addendum)
It was a pleasure meeting you today. Thank you for allowing me to take part in your health care.  Our goals for today as we discussed include:  Please review medications listed as well as information of menopause  Can also check with Delrae Rend MD.  They deal with hormonal concerns for menopause.  Once you have reviewed please let me know what you are interested in and we can discuss further.  Follow up as needed   If you have any questions or concerns, please do not hesitate to call the office at [phone].  I look forward to our next visit and until then take care and stay safe.  Regards,   Dana Allan, MD   [facility]

## 2023-10-16 ENCOUNTER — Encounter: Payer: Self-pay | Admitting: Family Medicine

## 2023-10-16 DIAGNOSIS — R232 Flushing: Secondary | ICD-10-CM | POA: Insufficient documentation

## 2023-10-16 NOTE — Assessment & Plan Note (Signed)
Persistent hot flashes for the past six months, not associated with sweating or need to change clothes. Currently on Fluoxetine, which is not providing relief. Patient has a history of partial hysterectomy. No history of high blood pressure, blood clots, or migraines. -Consider non-hormonal treatment options such as Veozah, pending insurance approval and liver function tests. -Consider Gabapentin as an off-label treatment option. -Consider switching from Fluoxetine to Paxil, although patient is currently doing well on Fluoxetine. -Consider referral to [facility] MD for specialized menopausal symptom management. -Send patient information on various treatment options for review and shared decision-making. -Schedule blood work next month to check TSH and A1C levels, as these were not done previously. -Follow-up appointment to be scheduled at patient's discretion once she has reviewed treatment options.

## 2023-10-22 ENCOUNTER — Ambulatory Visit: Payer: PRIVATE HEALTH INSURANCE

## 2023-11-08 ENCOUNTER — Other Ambulatory Visit: Payer: Self-pay | Admitting: Family Medicine

## 2023-11-08 DIAGNOSIS — G4700 Insomnia, unspecified: Secondary | ICD-10-CM

## 2023-11-22 ENCOUNTER — Other Ambulatory Visit (INDEPENDENT_AMBULATORY_CARE_PROVIDER_SITE_OTHER): Payer: PRIVATE HEALTH INSURANCE

## 2023-11-22 DIAGNOSIS — E669 Obesity, unspecified: Secondary | ICD-10-CM

## 2023-11-22 DIAGNOSIS — E785 Hyperlipidemia, unspecified: Secondary | ICD-10-CM | POA: Diagnosis not present

## 2023-11-22 LAB — CBC WITH DIFFERENTIAL/PLATELET
Basophils Absolute: 0 10*3/uL (ref 0.0–0.1)
Basophils Relative: 0.8 % (ref 0.0–3.0)
Eosinophils Absolute: 0.3 10*3/uL (ref 0.0–0.7)
Eosinophils Relative: 8.1 % — ABNORMAL HIGH (ref 0.0–5.0)
HCT: 38.3 % (ref 36.0–46.0)
Hemoglobin: 12.8 g/dL (ref 12.0–15.0)
Lymphocytes Relative: 42.4 % (ref 12.0–46.0)
Lymphs Abs: 1.5 10*3/uL (ref 0.7–4.0)
MCHC: 33.4 g/dL (ref 30.0–36.0)
MCV: 95 fL (ref 78.0–100.0)
Monocytes Absolute: 0.3 10*3/uL (ref 0.1–1.0)
Monocytes Relative: 8.7 % (ref 3.0–12.0)
Neutro Abs: 1.4 10*3/uL (ref 1.4–7.7)
Neutrophils Relative %: 40 % — ABNORMAL LOW (ref 43.0–77.0)
Platelets: 250 10*3/uL (ref 150.0–400.0)
RBC: 4.03 Mil/uL (ref 3.87–5.11)
RDW: 12.7 % (ref 11.5–15.5)
WBC: 3.5 10*3/uL — ABNORMAL LOW (ref 4.0–10.5)

## 2023-11-22 LAB — LIPID PANEL
Cholesterol: 171 mg/dL (ref 0–200)
HDL: 42.9 mg/dL (ref >39.00)
LDL Cholesterol: 108 mg/dL — ABNORMAL HIGH (ref 0–99)
NonHDL: 128.13
Total CHOL/HDL Ratio: 4
Triglycerides: 100 mg/dL (ref 0.0–149.0)
VLDL: 20 mg/dL (ref 0.0–40.0)

## 2023-11-22 LAB — COMPREHENSIVE METABOLIC PANEL
ALT: 21 U/L (ref 0–35)
AST: 18 U/L (ref 0–37)
Albumin: 3.8 g/dL (ref 3.5–5.2)
Alkaline Phosphatase: 118 U/L — ABNORMAL HIGH (ref 39–117)
BUN: 13 mg/dL (ref 6–23)
CO2: 26 meq/L (ref 19–32)
Calcium: 8.7 mg/dL (ref 8.4–10.5)
Chloride: 105 meq/L (ref 96–112)
Creatinine, Ser: 0.85 mg/dL (ref 0.40–1.20)
GFR: 78.82 mL/min (ref >60.00)
Glucose, Bld: 99 mg/dL (ref 70–99)
Potassium: 3.8 meq/L (ref 3.5–5.1)
Sodium: 139 meq/L (ref 135–145)
Total Bilirubin: 0.4 mg/dL (ref 0.2–1.2)
Total Protein: 7.2 g/dL (ref 6.0–8.3)

## 2023-11-22 LAB — HEMOGLOBIN A1C: Hgb A1c MFr Bld: 5.7 % (ref 4.6–6.5)

## 2023-11-22 LAB — TSH: TSH: 1.6 u[IU]/mL (ref 0.35–5.50)

## 2023-11-22 LAB — VITAMIN D 25 HYDROXY (VIT D DEFICIENCY, FRACTURES): VITD: 37.85 ng/mL (ref 30.00–100.00)

## 2023-11-22 LAB — VITAMIN B12: Vitamin B-12: 743 pg/mL (ref 211–911)

## 2023-11-29 ENCOUNTER — Ambulatory Visit (INDEPENDENT_AMBULATORY_CARE_PROVIDER_SITE_OTHER): Payer: PRIVATE HEALTH INSURANCE | Admitting: Family Medicine

## 2023-11-29 ENCOUNTER — Encounter: Payer: Self-pay | Admitting: Family Medicine

## 2023-11-29 VITALS — BP 124/82 | HR 69 | Temp 98.4°F | Resp 18 | Ht 65.0 in | Wt 226.5 lb

## 2023-11-29 DIAGNOSIS — R232 Flushing: Secondary | ICD-10-CM | POA: Diagnosis not present

## 2023-11-29 DIAGNOSIS — Z23 Encounter for immunization: Secondary | ICD-10-CM

## 2023-11-29 DIAGNOSIS — G4700 Insomnia, unspecified: Secondary | ICD-10-CM | POA: Diagnosis not present

## 2023-11-29 DIAGNOSIS — F39 Unspecified mood [affective] disorder: Secondary | ICD-10-CM

## 2023-11-29 DIAGNOSIS — Z0000 Encounter for general adult medical examination without abnormal findings: Secondary | ICD-10-CM | POA: Diagnosis not present

## 2023-11-29 DIAGNOSIS — Z79899 Other long term (current) drug therapy: Secondary | ICD-10-CM

## 2023-11-29 DIAGNOSIS — Z1231 Encounter for screening mammogram for malignant neoplasm of breast: Secondary | ICD-10-CM

## 2023-11-29 DIAGNOSIS — H6123 Impacted cerumen, bilateral: Secondary | ICD-10-CM

## 2023-11-29 MED ORDER — TEMAZEPAM 30 MG PO CAPS: ORAL_CAPSULE | ORAL | 0 refills | Status: DC

## 2023-11-29 MED ORDER — FLUOXETINE HCL 10 MG PO CAPS: 30.0000 mg | ORAL_CAPSULE | Freq: Every day | ORAL | 1 refills | Status: DC

## 2023-11-29 NOTE — Patient Instructions (Addendum)
It was a pleasure meeting you today. Thank you for allowing me to take part in your health care.  Our goals for today as we discussed include:  Stop Prozac 20 mg Start Prozac 10 mg, 3 tablets daily.  Total 30 mg daily. Please send MyChart notification of any side effects from increase in medication  Referral sent for Mammogram. Please call to schedule appointment. Due Mar 26. [facility] [address] [place], [place] [address] [phone]    Flu vaccine administered today  Debrox 5 drops to left ear for 5 days If no improvement schedule nurse appointment for ear irrigation   This is a list of the screening recommended for you and due dates:  Health Maintenance  Topic Date Due   HIV Screening  Never done   Flu Shot  07/08/2023   COVID-19 Vaccine (4 - 2024-25 season) 08/08/2023   Mammogram  03/01/2024   Colon Cancer Screening  02/08/2031   DTaP/Tdap/Td vaccine (2 - Td or Tdap) 11/24/2032   Hepatitis C Screening  Completed   Zoster (Shingles) Vaccine  Completed   HPV Vaccine  Aged Out    Follow up in 6 weeks  If you have any questions or concerns, please do not hesitate to call the office at [phone].  I look forward to our next visit and until then take care and stay safe.  Regards,   Dana Allan, MD   [facility]

## 2023-11-29 NOTE — Progress Notes (Unsigned)
SUBJECTIVE:   Chief Complaint  Patient presents with   Annual Exam   HPI Presents for annual physical  PERTINENT PMH / PSH: ***  OBJECTIVE:  BP 124/82   Pulse 69   Temp 98.4 F (36.9 C)   Resp 18   Ht 5\' 5"  (1.651 m)   Wt 226 lb 8 oz (102.7 kg)   LMP  (LMP Unknown)   SpO2 100%   BMI 37.69 kg/m    Physical Exam Vitals reviewed.  Constitutional:      General: She is not in acute distress.    Appearance: She is not ill-appearing.  HENT:     Head: Normocephalic.     Right Ear: Tympanic membrane, ear canal and external ear normal.     Left Ear: Ear canal and external ear normal. There is impacted cerumen.     Nose: Nose normal.     Mouth/Throat:     Mouth: Mucous membranes are moist.  Eyes:     Extraocular Movements: Extraocular movements intact.     Conjunctiva/sclera: Conjunctivae normal.     Pupils: Pupils are equal, round, and reactive to light.  Neck:     Thyroid: No thyromegaly or thyroid tenderness.     Vascular: No carotid bruit.  Cardiovascular:     Rate and Rhythm: Normal rate and regular rhythm.     Pulses: Normal pulses.     Heart sounds: Normal heart sounds.  Pulmonary:     Effort: Pulmonary effort is normal.     Breath sounds: Normal breath sounds.  Abdominal:     General: Bowel sounds are normal. There is no distension.     Palpations: Abdomen is soft.     Tenderness: There is no abdominal tenderness. There is no right CVA tenderness, left CVA tenderness, guarding or rebound.  Musculoskeletal:        General: Normal range of motion.     Cervical back: Normal range of motion.     Right lower leg: No edema.     Left lower leg: No edema.  Lymphadenopathy:     Cervical: No cervical adenopathy.  Skin:    Capillary Refill: Capillary refill takes less than 2 seconds.  Neurological:     General: No focal deficit present.     Mental Status: She is alert and oriented to person, place, and time. Mental status is at baseline.     Motor: No  weakness.  Psychiatric:        Mood and Affect: Mood normal.        Behavior: Behavior normal.        Thought Content: Thought content normal.        Judgment: Judgment normal.        11/29/2023    9:18 AM 10/11/2023    4:32 PM 03/30/2023    4:14 PM 03/16/2023    4:16 PM 11/24/2022    9:09 AM  Depression screen PHQ 2/9  Decreased Interest 1 0 0 0 0  Down, Depressed, Hopeless 1 0 0 0 0  PHQ - 2 Score 2 0 0 0 0  Altered sleeping 0 0 3  0  Tired, decreased energy 1 1 2   0  Change in appetite 2 2 3   0  Feeling bad or failure about yourself  1 0 0  0  Trouble concentrating 0 0 0  0  Moving slowly or fidgety/restless 0  0  0  Suicidal thoughts 0 0 0  0  PHQ-9  Score 6 3 8   0  Difficult doing work/chores Not difficult at all Somewhat difficult Very difficult  Not difficult at all      11/29/2023    9:18 AM 10/11/2023    4:33 PM 03/30/2023    4:15 PM 03/16/2023    4:16 PM  GAD 7 : Generalized Anxiety Score  Nervous, Anxious, on Edge 1 0 0 0  Control/stop worrying 0 0 0 0  Worry too much - different things 0 0 0 0  Trouble relaxing 0 0 0 0  Restless 0 0 0 0  Easily annoyed or irritable 1 1 0 0  Afraid - awful might happen 0 0 0 0  Total GAD 7 Score 2 1 0 0  Anxiety Difficulty Not difficult at all Not difficult at all Not difficult at all Not difficult at all    ASSESSMENT/PLAN:  Benzodiazepine contract exists -     ToxASSURE Select 13 (MW), Urine  Insomnia, unspecified type   PDMP reviewed***  No follow-ups on file.  Dana Allan, MD

## 2023-12-02 ENCOUNTER — Ambulatory Visit: Payer: PRIVATE HEALTH INSURANCE | Admitting: Nurse Practitioner

## 2023-12-02 ENCOUNTER — Encounter: Payer: Self-pay | Admitting: Family Medicine

## 2023-12-02 ENCOUNTER — Encounter: Payer: Self-pay | Admitting: Nurse Practitioner

## 2023-12-02 VITALS — BP 118/74 | HR 69 | Temp 97.6°F | Ht 65.0 in | Wt 229.0 lb

## 2023-12-02 DIAGNOSIS — H6122 Impacted cerumen, left ear: Secondary | ICD-10-CM

## 2023-12-02 DIAGNOSIS — H6120 Impacted cerumen, unspecified ear: Secondary | ICD-10-CM | POA: Insufficient documentation

## 2023-12-02 DIAGNOSIS — L299 Pruritus, unspecified: Secondary | ICD-10-CM | POA: Diagnosis not present

## 2023-12-02 LAB — TOXASSURE SELECT 13 (MW), URINE

## 2023-12-02 MED ORDER — NEOMYCIN-POLYMYXIN-HC 1 % OT SOLN: OTIC | 0 refills | Status: DC

## 2023-12-02 NOTE — Progress Notes (Signed)
Ear Cerumen Removal  Date/Time: 12/02/2023 4:48 PM  Performed by: Francene Boyers, CMA Authorized by: Kara Dies, NP  Location details: left ear Procedure type: irrigation  Sedation: Patient sedated: no

## 2023-12-02 NOTE — Assessment & Plan Note (Signed)
Managed with current medication -Continue Temazepam 30 mg qhs

## 2023-12-02 NOTE — Progress Notes (Signed)
Established Patient Office Visit  Subjective:  Patient ID: Erica Mccall, female    DOB: 06/09/1971  Age: 51 y.o. MRN: [id number]  CC:  Chief Complaint  Patient presents with   Acute Visit    Clogged ears   Discussed the use of a AI scribe  software for clinical note transcription with the patient, who gave verbal consent to proceed.   HPI  Erica Mccall  presented for follow-up after a recent physical examination where wax was noted in the ear. They were advised to use Debrox, a wax-removing agent, which they did on Monday. Following the use of Debrox, the patient experienced a sensation of clogging in the ear, which was not present prior to the treatment. This clogging sensation has persisted since Monday and has been associated with difficulty hearing from the affected ear.  In addition to the clogging issue, the patient reported a history of itching in the same ear since recovering from COVID-19. They also noted occasional malodor from the ear. The patient had been using ear drops prescribed by a previous physician and cleaning the ear with Q-tips, but the itching persisted. The patient had never experienced such ear problems prior to their COVID-19 infection.  HPI   Past Medical History:  Diagnosis Date   Abnormal thyroid function test 06/13/2018   Allergic rhinitis 07/10/2016   Allergy    Anxiety    Anxiety    Anxiety and depression 02/07/2018   ASCUS with positive high risk HPV cervical 12/2018   Asymptomatic microscopic hematuria 12/18/2021   Blood in stool    BMI 38.0-38.9,adult 06/24/2022   BV (bacterial vaginosis)    BV (bacterial vaginosis) 12/18/2021   Chicken pox    Contact dermatitis 08/03/2022   Depression, recurrent (HCC) 11/20/2020   Endometriosis    Family history of breast cancer 12/13/2017   Headache    Insomnia    Leg edema, left 02/07/2018   S/p fracture leg and ankle 10/2017    Leiomyoma of uterus    Menorrhagia    Seasonal allergies    Stress  07/16/2020   UTI (urinary tract infection)    Vitamin D deficiency     Past Surgical History:  Procedure Laterality Date   COLONOSCOPY     ~18 yrs ago    ENDOMETRIAL BIOPSY     ESOPHAGOGASTRODUODENOSCOPY     HYSTEROSCOPY  2004   LEG SURGERY  10/13/2017   s/p fall    ORIF ANKLE FRACTURE Left 10/13/2017   Procedure: OPEN REDUCTION INTERNAL FIXATION (ORIF) ANKLE FRACTURE;  Surgeon: Lyndle Herrlich, MD;  Location: [facility] ORS;  Service: Orthopedics;  Laterality: Left;   TOTAL ABDOMINAL HYSTERECTOMY  01/2005   leio, endometriosis; Dr. Arvil Chaco; has ovaries and she thinks cervix   UPPER GASTROINTESTINAL ENDOSCOPY      Family History  Problem Relation Age of Onset   Breast cancer Mother 31   Hypertension Mother    Luiz Blare' disease Mother    Transient ischemic attack Mother    Aneurysm Mother        x3 brain   Aortic aneurysm Mother    Hypertension Brother    Colon cancer Maternal Grandmother 41   Lung cancer Maternal Grandfather    Prostate cancer Maternal Grandfather    Diabetes Paternal Grandmother    Thyroid disease Maternal Aunt    Breast cancer Paternal Aunt 76   Diabetes Paternal Aunt    Colon polyps Neg Hx    Esophageal cancer Neg Hx  Rectal cancer Neg Hx    Stomach cancer Neg Hx     Social History   Socioeconomic History   Marital status: Divorced    Spouse name: Not on file   Number of children: Not on file   Years of education: Not on file   Highest education level: Associate degree: academic program  Occupational History   Not on file  Tobacco Use   Smoking status: Never   Smokeless tobacco: Never  Vaping Use   Vaping status: Never Used  Substance and Sexual Activity   Alcohol use: Yes    Comment: occasionally   Drug use: No   Sexual activity: Yes    Birth control/protection: Surgical    Comment: Hysterectomy  Other Topics Concern   Not on file  Social History Narrative   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    Wears seat belts     No guns    1 son in military lives in [place], [place] granddaughter age 12 y.o as of 06/2018 lives in [place] with her mother      Social Drivers of Health   Financial Resource Strain: Low Risk  (12/02/2023)   Overall Financial Resource Strain (CARDIA)    Difficulty of Paying Living Expenses: Not very hard  Food Insecurity: No Food Insecurity (12/02/2023)   Hunger Vital Sign    Worried About Running Out of Food in the Last Year: Never true    Ran Out of Food in the Last Year: Never true  Transportation Needs: No Transportation Needs (12/02/2023)   PRAPARE - Administrator, Civil Service (Medical): No    Lack of Transportation (Non-Medical): No  Physical Activity: Inactive (12/02/2023)   Exercise Vital Sign    Days of Exercise per Week: 0 days    Minutes of Exercise per Session: 0 min  Stress: No Stress Concern Present (12/02/2023)   [employer] of Occupational Health - Occupational Stress Questionnaire    Feeling of Stress : Only a little  Social Connections: Moderately Integrated (12/02/2023)   Social Connection and Isolation Panel [NHANES]    Frequency of Communication with Friends and Family: More than three times a week    Frequency of Social Gatherings with Friends and Family: Once a week    Attends Religious Services: More than 4 times per year    Active Member of [employer] or Organizations: Yes    Attends Engineer, structural: More than 4 times per year    Marital Status: Divorced  Catering manager Violence: Not on file     Outpatient Medications Prior to Visit  Medication Sig Dispense Refill   Cholecalciferol (VITAMIN D3) 50000 units CAPS Take 50,000 Units every Thursday by mouth.   2   FLUoxetine (PROZAC) 10 MG capsule Take 3 capsules (30 mg total) by mouth daily. 90 capsule 1   fluticasone (FLONASE) 50 MCG/ACT nasal spray Place 2 sprays into both nostrils daily. 15.8 mL 2   ibuprofen (ADVIL,MOTRIN) 200 MG tablet Take 400 mg every 6 (six) hours as needed  by mouth for headache.     Probiotic CAPS Take 1 capsule daily by mouth.     [START ON 12/11/2023] temazepam (RESTORIL) 30 MG capsule TAKE 1 CAPSULE BY MOUTH AT NIGHT AS NEEDED FOR SLEEP 30 capsule 0   No facility-administered medications prior to visit.    Allergies  Allergen Reactions   Iodine Hives   Latex Itching   Adipex-P [Phentermine]  jittery   Fish Allergy     Hives even to smell of fish   Pollen Extract    Valium [Diazepam]     Does not like way makes her feel     ROS Review of Systems Negative unless indicated in HPI.    Objective:    Physical Exam Constitutional:      Appearance: Normal appearance.  HENT:     Right Ear: Tympanic membrane normal.     Left Ear: There is impacted cerumen.  Cardiovascular:     Rate and Rhythm: Normal rate and regular rhythm.     Pulses: Normal pulses.     Heart sounds: Normal heart sounds.  Pulmonary:     Effort: Pulmonary effort is normal.     Breath sounds: Normal breath sounds. No stridor. No wheezing.  Neurological:     General: No focal deficit present.     Mental Status: She is alert and oriented to person, place, and time. Mental status is at baseline.     BP 118/74   Pulse 69   Temp 97.6 F (36.4 C)   Ht 5\' 5"  (1.651 m)   Wt 229 lb (103.9 kg)   LMP  (LMP Unknown)   SpO2 99%   BMI 38.11 kg/m  Wt Readings from Last 3 Encounters:  12/02/23 229 lb (103.9 kg)  11/29/23 226 lb 8 oz (102.7 kg)  10/11/23 225 lb (102.1 kg)     Health Maintenance  Topic Date Due   COVID-19 Vaccine (4 - 2024-25 season) 12/15/2023 (Originally 08/08/2023)   HIV Screening  11/28/2024 (Originally 07/[birth year]/1986)   MAMMOGRAM  03/01/2024   Colonoscopy  02/08/2031   DTaP/Tdap/Td (2 - Td or Tdap) 11/24/2032   INFLUENZA VACCINE  Completed   Hepatitis C Screening  Completed   Zoster Vaccines- Shingrix  Completed   HPV VACCINES  Aged Out    There are no preventive care reminders to display for this patient.  Lab Results  Component  Value Date   TSH 1.60 11/22/2023   Lab Results  Component Value Date   WBC 3.5 (L) 11/22/2023   HGB 12.8 11/22/2023   HCT 38.3 11/22/2023   MCV 95.0 11/22/2023   PLT 250.0 11/22/2023   Lab Results  Component Value Date   NA 139 11/22/2023   K 3.8 11/22/2023   CO2 26 11/22/2023   GLUCOSE 99 11/22/2023   BUN 13 11/22/2023   CREATININE 0.85 11/22/2023   BILITOT 0.4 11/22/2023   ALKPHOS 118 (H) 11/22/2023   AST 18 11/22/2023   ALT 21 11/22/2023   PROT 7.2 11/22/2023   ALBUMIN 3.8 11/22/2023   CALCIUM 8.7 11/22/2023   GFR 78.82 11/22/2023   Lab Results  Component Value Date   CHOL 171 11/22/2023   Lab Results  Component Value Date   HDL 42.90 11/22/2023   Lab Results  Component Value Date   LDLCALC 108 (H) 11/22/2023   Lab Results  Component Value Date   TRIG 100.0 11/22/2023   Lab Results  Component Value Date   CHOLHDL 4 11/22/2023   Lab Results  Component Value Date   HGBA1C 5.7 11/22/2023      Assessment & Plan:  Impacted cerumen of left ear Assessment & Plan: Ear clogged after using Debrox for wax removal. Decreased hearing in the affected ear. No prior history of this issue. -Perform ear lavage to remove impacted cerumen. -Advise against using Q-tips as they can push wax further into the ear canal. -Hearing improved  after lavage and TM visible.   Itching of ear Assessment & Plan: Persistent itching in one ear since recovering from COVID-19.  -We will do trial of polymyxin eardrops -If symptoms persist, refer to ENT for further evaluation.   Other orders -     Neomycin-Polymyxin-HC; 4 gtt in affected ear(s) tid, for 5 days. Lie with affected ear upward x 5 minutes  Dispense: 10 mL; Refill: 0 -     Ear Cerumen Removal    Follow-up: Return if symptoms worsen or fail to improve.   Kara Dies, NP

## 2023-12-02 NOTE — Assessment & Plan Note (Signed)
Increased anxiety potentially related to menopausal symptoms. Currently on Prozac. -Increase Prozac to 30mg  daily. -Continue Temazepam 30 mg at bedtime -New non opioid contract signed today

## 2023-12-02 NOTE — Assessment & Plan Note (Signed)
Healthy 52 year old female.  Normotensive Colonoscopy up-to-date Mammogram up-to-date.  Reordered for 02/2024 No indication for PAP.  TAH 2006 Shingles up-to-date Tdap up to date Declined HIV screening Continue healthy lifestyle choices Annual labs reviewed

## 2023-12-02 NOTE — Assessment & Plan Note (Signed)
Increased hot flashes and potential anxiety. No current management by OBGYN. -Discussed potential options including lifestyle modifications, estrogen therapy, and referral to menopausal specialist at [facility]. -Not candidate for HRT given family history of first degree relative with breast cancer. -Consider low dose SSRI if wanting to initiate medication.  Patient will consider.

## 2023-12-02 NOTE — Assessment & Plan Note (Addendum)
Complaints of itchy left ear, likely due to cerumen impaction. -Recommend over-the-counter Debrox drops twice daily for 5 days in left ear -Avoid use of Q-tips. -If symptoms persist, consider visit to nurse clinic for ear irrigation.

## 2023-12-05 DIAGNOSIS — L299 Pruritus, unspecified: Secondary | ICD-10-CM | POA: Insufficient documentation

## 2023-12-05 NOTE — Assessment & Plan Note (Signed)
Persistent itching in one ear since recovering from COVID-19.  -We will do trial of polymyxin eardrops -If symptoms persist, refer to ENT for further evaluation.

## 2023-12-05 NOTE — Assessment & Plan Note (Signed)
Ear clogged after using Debrox for wax removal. Decreased hearing in the affected ear. No prior history of this issue. -Perform ear lavage to remove impacted cerumen. -Advise against using Q-tips as they can push wax further into the ear canal. -Hearing improved after lavage and TM visible.

## 2023-12-16 ENCOUNTER — Encounter: Payer: Self-pay | Admitting: Family Medicine

## 2023-12-16 ENCOUNTER — Other Ambulatory Visit: Payer: Self-pay | Admitting: Family

## 2023-12-16 DIAGNOSIS — F39 Unspecified mood [affective] disorder: Secondary | ICD-10-CM

## 2023-12-16 MED ORDER — FLUOXETINE HCL 10 MG PO CAPS: 20.0000 mg | ORAL_CAPSULE | Freq: Every day | ORAL | Status: DC

## 2023-12-16 NOTE — Telephone Encounter (Signed)
 Fyi.

## 2023-12-17 NOTE — Telephone Encounter (Signed)
 Noted.

## 2024-01-05 ENCOUNTER — Other Ambulatory Visit: Payer: Self-pay | Admitting: Family Medicine

## 2024-01-05 DIAGNOSIS — G4700 Insomnia, unspecified: Secondary | ICD-10-CM

## 2024-01-10 ENCOUNTER — Ambulatory Visit: Payer: PRIVATE HEALTH INSURANCE | Admitting: Family Medicine

## 2024-02-06 ENCOUNTER — Other Ambulatory Visit: Payer: Self-pay | Admitting: Family Medicine

## 2024-02-06 DIAGNOSIS — G4700 Insomnia, unspecified: Secondary | ICD-10-CM

## 2024-02-13 NOTE — Progress Notes (Unsigned)
 PCP:  Dana Allan, MD   No chief complaint on file.    HPI:      [name] is a 53 y.o. [phone] who LMP was No LMP recorded (lmp unknown). Patient has had a hysterectomy., presents today for her annual examination.  Her menses are absent due to TAH for leio/endometriosis 2006. She does not have PMB. Tolerable vasomotor sx.  Sex activity: single partner, contraception - status post hysterectomy. No pain/bleeding. Last Pap:  01/16/21 and 01/15/20 Results were normal/ neg HPV DNA. 12/14/18  Results were ASCUS with pos HPV DNA; colpo with Dr. Jean Rosenthal 1/20 with neg bx; repeat pap Q3 yrs now Hx of STDs: HPV on pap  Hx of BV in past.  Last mammogram: 03/02/23 Results were: normal--routine follow-up in 12 months; has appt 3/25 through PCP There is a FH of breast cancer in her pat aunt. Pt is BRCA neg 2014, IBIS=16.8%. Pt has declined update testing in past. There is no FH of ovarian cancer. The patient does self-breast exams. Has intermittent bilat nipple tenderness. Drinks some caffeine.   Tobacco use: The patient denies current or previous tobacco use. Alcohol use: occas No drug use.  Exercise: not active  She does get adequate calcium and Vitamin D in her diet.  Colonoscopy:  3/22 at [facility], repeat after 10 yrs per pt; FH in MGM about age 43  Labs with PCP.    Past Medical History:  Diagnosis Date   Abnormal thyroid function test 06/13/2018   Allergic rhinitis 07/10/2016   Allergy    Anxiety    Anxiety    Anxiety and depression 02/07/2018   ASCUS with positive high risk HPV cervical 12/2018   Asymptomatic microscopic hematuria 12/18/2021   Blood in stool    BMI 38.0-38.9,adult 06/24/2022   BV (bacterial vaginosis)    BV (bacterial vaginosis) 12/18/2021   Chicken pox    Contact dermatitis 08/03/2022   Depression, recurrent (HCC) 11/20/2020   Endometriosis    Family history of breast cancer 12/13/2017   Headache    Insomnia    Leg edema, left 02/07/2018   S/p  fracture leg and ankle 10/2017    Leiomyoma of uterus    Menorrhagia    Seasonal allergies    Stress 07/16/2020   UTI (urinary tract infection)    Vitamin D deficiency     Past Surgical History:  Procedure Laterality Date   COLONOSCOPY     ~18 yrs ago    ENDOMETRIAL BIOPSY     ESOPHAGOGASTRODUODENOSCOPY     HYSTEROSCOPY  2004   LEG SURGERY  10/13/2017   s/p fall    ORIF ANKLE FRACTURE Left 10/13/2017   Procedure: OPEN REDUCTION INTERNAL FIXATION (ORIF) ANKLE FRACTURE;  Surgeon: Lyndle Herrlich, MD;  Location: [facility] ORS;  Service: Orthopedics;  Laterality: Left;   TOTAL ABDOMINAL HYSTERECTOMY  01/2005   leio, endometriosis; Dr. Arvil Chaco; has ovaries and she thinks cervix   UPPER GASTROINTESTINAL ENDOSCOPY      Family History  Problem Relation Age of Onset   Breast cancer Mother 79   Hypertension Mother    Luiz Blare' disease Mother    Transient ischemic attack Mother    Aneurysm Mother        x3 brain   Aortic aneurysm Mother    Hypertension Brother    Colon cancer Maternal Grandmother 21   Lung cancer Maternal Grandfather    Prostate cancer Maternal Grandfather    Diabetes Paternal Grandmother  Thyroid disease Maternal Aunt    Breast cancer Paternal Aunt 41   Diabetes Paternal Aunt    Colon polyps Neg Hx    Esophageal cancer Neg Hx    Rectal cancer Neg Hx    Stomach cancer Neg Hx     Social History   Socioeconomic History   Marital status: Divorced    Spouse name: Not on file   Number of children: Not on file   Years of education: Not on file   Highest education level: Associate degree: academic program  Occupational History   Not on file  Tobacco Use   Smoking status: Never   Smokeless tobacco: Never  Vaping Use   Vaping status: Never Used  Substance and Sexual Activity   Alcohol use: Yes    Comment: occasionally   Drug use: No   Sexual activity: Yes    Birth control/protection: Surgical    Comment: Hysterectomy  Other Topics Concern   Not on file   Social History Narrative   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    Wears seat belts    No guns    1 son in [employer] lives in [place], [place] granddaughter age 79 y.o as of 06/2018 lives in [place] with her mother      Social Drivers of Health   Financial Resource Strain: Medium Risk (02/02/2024)   Received from [facility]   Overall Financial Resource Strain (CARDIA)    Difficulty of Paying Living Expenses: Somewhat hard  Food Insecurity: Food Insecurity Present (02/02/2024)   Received from [facility]   Hunger Vital Sign    Worried About Running Out of Food in the Last Year: Sometimes true    Ran Out of Food in the Last Year: Sometimes true  Transportation Needs: No Transportation Needs (02/02/2024)   Received from [facility]   PRAPARE - Transportation    In the past 12 months, has lack of transportation kept you from medical appointments or from getting medications?: No    Lack of Transportation (Non-Medical): No  Physical Activity: Inactive (12/02/2023)   Exercise Vital Sign    Days of Exercise per Week: 0 days    Minutes of Exercise per Session: 0 min  Stress: No Stress Concern Present (12/02/2023)   [employer] of Occupational Health - Occupational Stress Questionnaire    Feeling of Stress : Only a little  Social Connections: Moderately Integrated (12/02/2023)   Social Connection and Isolation Panel [NHANES]    Frequency of Communication with Friends and Family: More than three times a week    Frequency of Social Gatherings with Friends and Family: Once a week    Attends Religious Services: More than 4 times per year    Active Member of [employer] or Organizations: Yes    Attends Banker Meetings: More than 4 times per year    Marital Status: Divorced  Intimate Partner Violence: Not on file    No outpatient medications have been marked as taking for the 02/14/24 encounter Administrator, Civil Service) with Amika Tassin,  Richardson Dubree B, PA-C.     ROS:  Review of Systems  Constitutional:  Negative for fatigue, fever and unexpected weight change.  Respiratory:  Negative for cough, shortness of breath and wheezing.   Cardiovascular:  Negative for chest pain, palpitations and leg swelling.  Gastrointestinal:  Negative for blood in stool, constipation, diarrhea, nausea and vomiting.  Endocrine: Negative for cold intolerance, heat intolerance  and polyuria.  Genitourinary:  Negative for dyspareunia, dysuria, flank pain, frequency, genital sores, hematuria, menstrual problem, pelvic pain, urgency, vaginal bleeding, vaginal discharge and vaginal pain.  Musculoskeletal:  Negative for back pain, joint swelling and myalgias.  Skin:  Negative for rash.  Neurological:  Negative for dizziness, syncope, light-headedness, numbness and headaches.  Hematological:  Negative for adenopathy.  Psychiatric/Behavioral:  Negative for agitation, confusion, sleep disturbance and suicidal ideas. The patient is not nervous/anxious.      Objective: LMP  (LMP Unknown)    Physical Exam Constitutional:      Appearance: She is well-developed.  Genitourinary:     Vulva normal.     Genitourinary Comments: UTERUS/CX SURG REM     Right Labia: No rash, tenderness or lesions.    Left Labia: No tenderness, lesions or rash.    Vaginal cuff intact.    No vaginal discharge, erythema or tenderness.      Right Adnexa: not tender and no mass present.    Left Adnexa: not tender and no mass present.    Cervix is absent.     Uterus is absent.  Breasts:    Right: No mass, nipple discharge, skin change or tenderness.     Left: No mass, nipple discharge, skin change or tenderness.  Neck:     Thyroid: No thyromegaly.  Cardiovascular:     Rate and Rhythm: Normal rate and regular rhythm.     Heart sounds: Normal heart sounds. No murmur heard. Pulmonary:     Effort: Pulmonary effort is normal.     Breath sounds: Normal breath sounds.  Abdominal:      Palpations: Abdomen is soft.     Tenderness: There is no abdominal tenderness. There is no guarding.  Musculoskeletal:        General: Normal range of motion.     Cervical back: Normal range of motion.  Neurological:     General: No focal deficit present.     Mental Status: She is alert and oriented to person, place, and time.     Cranial Nerves: No cranial nerve deficit.  Skin:    General: Skin is warm and dry.  Psychiatric:        Mood and Affect: Mood normal.        Behavior: Behavior normal.        Thought Content: Thought content normal.        Judgment: Judgment normal.  Vitals reviewed.     Assessment/Plan: Encounter for annual routine gynecological examination  Encounter for screening mammogram for malignant neoplasm of breast; pt has mammo order through PCP  Family history of breast cancer--pt is BRCA neg; MyRisk updated testing discussed and declined today. F/u prn.    GYN counsel breast self exam, mammography screening, adequate intake of calcium and vitamin D, diet and exercise     F/U  No follow-ups on file.  Barnaby Rippeon B. Saphronia Ozdemir, PA-C 02/13/2024 5:53 PM

## 2024-02-14 ENCOUNTER — Other Ambulatory Visit (HOSPITAL_COMMUNITY)
Admission: RE | Admit: 2024-02-14 | Discharge: 2024-02-14 | Disposition: A | Discharge status: Home / Self Care | Payer: PRIVATE HEALTH INSURANCE | Source: Ambulatory Visit | Attending: Obstetrics and Gynecology | Admitting: Obstetrics and Gynecology

## 2024-02-14 ENCOUNTER — Ambulatory Visit (INDEPENDENT_AMBULATORY_CARE_PROVIDER_SITE_OTHER): Payer: PRIVATE HEALTH INSURANCE | Admitting: Obstetrics and Gynecology

## 2024-02-14 ENCOUNTER — Encounter: Payer: Self-pay | Admitting: Obstetrics and Gynecology

## 2024-02-14 VITALS — BP 115/74 | HR 60 | Ht 65.0 in | Wt 234.0 lb

## 2024-02-14 DIAGNOSIS — Z1231 Encounter for screening mammogram for malignant neoplasm of breast: Secondary | ICD-10-CM

## 2024-02-14 DIAGNOSIS — Z1151 Encounter for screening for human papillomavirus (HPV): Secondary | ICD-10-CM

## 2024-02-14 DIAGNOSIS — Z124 Encounter for screening for malignant neoplasm of cervix: Secondary | ICD-10-CM | POA: Diagnosis present

## 2024-02-14 DIAGNOSIS — Z780 Asymptomatic menopausal state: Secondary | ICD-10-CM

## 2024-02-14 DIAGNOSIS — Z803 Family history of malignant neoplasm of breast: Secondary | ICD-10-CM

## 2024-02-14 DIAGNOSIS — Z01419 Encounter for gynecological examination (general) (routine) without abnormal findings: Secondary | ICD-10-CM

## 2024-02-14 NOTE — Patient Instructions (Signed)
 I value your feedback and you entrusting [place] with your care. If you get a [place] patient survey, I would appreciate you taking the time to let us know about your experience today. Thank you! ? ? ?

## 2024-02-15 ENCOUNTER — Encounter: Payer: Self-pay | Admitting: Obstetrics and Gynecology

## 2024-02-15 DIAGNOSIS — N951 Menopausal and female climacteric states: Secondary | ICD-10-CM

## 2024-02-15 LAB — ESTRADIOL: Estradiol: 25 pg/mL

## 2024-02-15 LAB — FOLLICLE STIMULATING HORMONE: FSH: 61.4 m[IU]/mL

## 2024-02-17 ENCOUNTER — Encounter: Payer: Self-pay | Admitting: Obstetrics and Gynecology

## 2024-02-17 LAB — CYTOLOGY - PAP
Comment: NEGATIVE
Diagnosis: UNDETERMINED — AB
High risk HPV: NEGATIVE

## 2024-02-17 MED ORDER — VEOZAH 45 MG PO TABS: 45.0000 mg | ORAL_TABLET | Freq: Every day | ORAL | 2 refills | Status: DC

## 2024-02-25 ENCOUNTER — Telehealth: Payer: Self-pay

## 2024-02-26 NOTE — Telephone Encounter (Signed)
 Pls call pt to see if she's having trouble getting veozah filled with coupon card. Also, how are menopausal sx with it. Not sure why getting this pharm request--never heard of them before. See if she reached out to them. Thx.

## 2024-02-28 ENCOUNTER — Other Ambulatory Visit: Payer: Self-pay | Admitting: Obstetrics and Gynecology

## 2024-02-28 DIAGNOSIS — N951 Menopausal and female climacteric states: Secondary | ICD-10-CM

## 2024-02-28 MED ORDER — VEOZAH 45 MG PO TABS: 45.0000 mg | ORAL_TABLET | Freq: Every day | ORAL | 0 refills | Status: DC

## 2024-02-28 NOTE — Progress Notes (Signed)
 Rx veozah to mail order pharmacy for coverage

## 2024-02-28 NOTE — Telephone Encounter (Signed)
 Rx eRxd to mail order pharm.

## 2024-03-02 ENCOUNTER — Ambulatory Visit
Admission: RE | Admit: 2024-03-02 | Discharge: 2024-03-02 | Disposition: A | Discharge status: Home / Self Care | Payer: PRIVATE HEALTH INSURANCE | Source: Ambulatory Visit | Attending: Family Medicine | Admitting: Family Medicine

## 2024-03-02 DIAGNOSIS — Z1231 Encounter for screening mammogram for malignant neoplasm of breast: Secondary | ICD-10-CM | POA: Diagnosis present

## 2024-04-26 NOTE — Telephone Encounter (Signed)
 Refill being requested. Isn't she due for monthly labs?

## 2024-04-27 ENCOUNTER — Other Ambulatory Visit: Payer: PRIVATE HEALTH INSURANCE

## 2024-04-27 ENCOUNTER — Other Ambulatory Visit: Payer: Self-pay | Admitting: Obstetrics and Gynecology

## 2024-04-27 DIAGNOSIS — N951 Menopausal and female climacteric states: Secondary | ICD-10-CM

## 2024-04-27 MED ORDER — VEOZAH 45 MG PO TABS: 45.0000 mg | ORAL_TABLET | Freq: Every day | ORAL | 0 refills | Status: DC

## 2024-04-27 NOTE — Progress Notes (Signed)
 Rx RF veozah . Recheck CMP in 3 months. Getting labs today.

## 2024-04-27 NOTE — Telephone Encounter (Signed)
 Yes, Rx RF eRxd. Needs to do labs again in 6 months if ones from today are normal.

## 2024-04-28 LAB — COMPREHENSIVE METABOLIC PANEL WITH GFR
ALT: 9 IU/L (ref 0–32)
AST: 15 IU/L (ref 0–40)
Albumin: 4.2 g/dL (ref 3.8–4.9)
Alkaline Phosphatase: 103 IU/L (ref 44–121)
BUN/Creatinine Ratio: 14 (ref 9–23)
BUN: 12 mg/dL (ref 6–24)
Bilirubin Total: 0.3 mg/dL (ref 0.0–1.2)
CO2: 19 mmol/L — ABNORMAL LOW (ref 20–29)
Calcium: 9 mg/dL (ref 8.7–10.2)
Chloride: 106 mmol/L (ref 96–106)
Creatinine, Ser: 0.88 mg/dL (ref 0.57–1.00)
Globulin, Total: 2.8 g/dL (ref 1.5–4.5)
Glucose: 70 mg/dL (ref 70–99)
Potassium: 4.1 mmol/L (ref 3.5–5.2)
Sodium: 140 mmol/L (ref 134–144)
Total Protein: 7 g/dL (ref 6.0–8.5)
eGFR: 79 mL/min/{1.73_m2} (ref >59)

## 2024-04-30 ENCOUNTER — Ambulatory Visit: Payer: Self-pay | Admitting: Obstetrics and Gynecology

## 2024-04-30 NOTE — Addendum Note (Signed)
 Addended by: Alyn Judge B on: 04/30/2024 09:05 PM   Modules accepted: Orders

## 2024-07-04 ENCOUNTER — Telehealth: Payer: Self-pay | Admitting: *Deleted

## 2024-07-04 NOTE — Telephone Encounter (Signed)
 To clarify, patient is no longer with [facility] for primary care.

## 2024-07-04 NOTE — Telephone Encounter (Signed)
 I spoke with patient and she states she had to go with a provider they have through her employer.

## 2024-07-04 NOTE — Telephone Encounter (Signed)
Needs TOC

## 2024-07-20 ENCOUNTER — Other Ambulatory Visit: Payer: Self-pay

## 2024-07-20 DIAGNOSIS — N951 Menopausal and female climacteric states: Secondary | ICD-10-CM

## 2024-07-20 MED ORDER — VEOZAH 45 MG PO TABS: 45.0000 mg | ORAL_TABLET | Freq: Every day | ORAL | 0 refills | Status: DC

## 2025-01-04 ENCOUNTER — Other Ambulatory Visit: Payer: Self-pay

## 2025-01-04 DIAGNOSIS — N951 Menopausal and female climacteric states: Secondary | ICD-10-CM

## 2025-01-04 MED ORDER — VEOZAH 45 MG PO TABS: 45.0000 mg | ORAL_TABLET | Freq: Every day | ORAL | 0 refills | Status: AC

## 2025-01-04 NOTE — Telephone Encounter (Signed)
 Pt requests Veozah  refill be sent to mail order pharmacy.  90 day refill authorized.  Pt will be due for an annual exam in March 2026.
